# Patient Record
Sex: Male | Born: 1955 | Race: White | Hispanic: No | Marital: Married | Smoking: Current every day smoker
Health system: Southern US, Community
[De-identification: ages and names within clinical notes are randomized; demographics above are authoritative.]

## PROBLEM LIST (undated history)

## (undated) DIAGNOSIS — K219 Gastro-esophageal reflux disease without esophagitis: Secondary | ICD-10-CM

## (undated) DIAGNOSIS — I255 Ischemic cardiomyopathy: Secondary | ICD-10-CM

## (undated) DIAGNOSIS — F329 Major depressive disorder, single episode, unspecified: Secondary | ICD-10-CM

## (undated) DIAGNOSIS — I219 Acute myocardial infarction, unspecified: Secondary | ICD-10-CM

## (undated) DIAGNOSIS — Z8489 Family history of other specified conditions: Secondary | ICD-10-CM

## (undated) DIAGNOSIS — I2510 Atherosclerotic heart disease of native coronary artery without angina pectoris: Secondary | ICD-10-CM

## (undated) DIAGNOSIS — Z720 Tobacco use: Secondary | ICD-10-CM

## (undated) DIAGNOSIS — I10 Essential (primary) hypertension: Secondary | ICD-10-CM

## (undated) DIAGNOSIS — IMO0001 Reserved for inherently not codable concepts without codable children: Secondary | ICD-10-CM

## (undated) DIAGNOSIS — G4700 Insomnia, unspecified: Secondary | ICD-10-CM

## (undated) DIAGNOSIS — E785 Hyperlipidemia, unspecified: Secondary | ICD-10-CM

## (undated) DIAGNOSIS — F32A Depression, unspecified: Secondary | ICD-10-CM

## (undated) DIAGNOSIS — M109 Gout, unspecified: Secondary | ICD-10-CM

## (undated) DIAGNOSIS — J45909 Unspecified asthma, uncomplicated: Secondary | ICD-10-CM

## (undated) HISTORY — DX: Gastro-esophageal reflux disease without esophagitis: K21.9

## (undated) HISTORY — DX: Unspecified asthma, uncomplicated: J45.909

## (undated) HISTORY — DX: Essential (primary) hypertension: I10

## (undated) HISTORY — DX: Gout, unspecified: M10.9

## (undated) HISTORY — DX: Hyperlipidemia, unspecified: E78.5

## (undated) HISTORY — PX: HAND SURGERY: SHX662

## (undated) HISTORY — DX: Acute myocardial infarction, unspecified: I21.9

## (undated) HISTORY — DX: Insomnia, unspecified: G47.00

## (undated) HISTORY — PX: KNEE ARTHROSCOPY: SUR90

## (undated) HISTORY — DX: Tobacco use: Z72.0

## (undated) HISTORY — PX: CARDIAC SURGERY: SHX584

## (undated) HISTORY — PX: CATARACT EXTRACTION: SUR2

---

## 2002-05-07 ENCOUNTER — Ambulatory Visit (HOSPITAL_BASED_OUTPATIENT_CLINIC_OR_DEPARTMENT_OTHER): Admission: RE | Admit: 2002-05-07 | Discharge: 2002-05-07 | Payer: Self-pay | Admitting: Orthopaedic Surgery

## 2002-09-15 ENCOUNTER — Emergency Department (HOSPITAL_COMMUNITY): Admission: EM | Admit: 2002-09-15 | Discharge: 2002-09-15 | Payer: Self-pay

## 2003-05-21 ENCOUNTER — Observation Stay (HOSPITAL_COMMUNITY): Admission: EM | Admit: 2003-05-21 | Discharge: 2003-05-22 | Payer: Self-pay | Admitting: Emergency Medicine

## 2003-05-23 ENCOUNTER — Encounter: Admission: RE | Admit: 2003-05-23 | Discharge: 2003-05-23 | Payer: Self-pay | Admitting: Family Medicine

## 2003-05-28 ENCOUNTER — Inpatient Hospital Stay (HOSPITAL_COMMUNITY): Admission: EM | Admit: 2003-05-28 | Discharge: 2003-06-04 | Payer: Self-pay

## 2003-06-30 ENCOUNTER — Encounter (HOSPITAL_COMMUNITY): Admission: RE | Admit: 2003-06-30 | Discharge: 2003-09-19 | Payer: Self-pay | Admitting: Cardiology

## 2003-11-24 ENCOUNTER — Inpatient Hospital Stay (HOSPITAL_COMMUNITY): Admission: EM | Admit: 2003-11-24 | Discharge: 2003-11-26 | Payer: Self-pay | Admitting: *Deleted

## 2003-12-29 ENCOUNTER — Emergency Department (HOSPITAL_COMMUNITY): Admission: EM | Admit: 2003-12-29 | Discharge: 2003-12-30 | Payer: Self-pay

## 2004-11-17 ENCOUNTER — Ambulatory Visit: Payer: Self-pay | Admitting: Cardiology

## 2004-11-22 ENCOUNTER — Ambulatory Visit: Payer: Self-pay

## 2005-08-18 ENCOUNTER — Ambulatory Visit: Payer: Self-pay | Admitting: Cardiology

## 2005-11-10 ENCOUNTER — Ambulatory Visit: Payer: Self-pay | Admitting: Cardiology

## 2006-04-19 ENCOUNTER — Ambulatory Visit: Payer: Self-pay | Admitting: Cardiology

## 2006-05-01 ENCOUNTER — Ambulatory Visit: Payer: Self-pay | Admitting: Cardiology

## 2006-05-22 ENCOUNTER — Ambulatory Visit: Payer: Self-pay | Admitting: Cardiology

## 2006-11-29 ENCOUNTER — Ambulatory Visit: Payer: Self-pay | Admitting: Cardiology

## 2007-09-20 ENCOUNTER — Ambulatory Visit: Payer: Self-pay | Admitting: Cardiology

## 2007-10-02 ENCOUNTER — Ambulatory Visit: Payer: Self-pay

## 2007-10-02 LAB — CONVERTED CEMR LAB
ALT: 19 units/L (ref 0–53)
AST: 19 units/L (ref 0–37)
Albumin: 4 g/dL (ref 3.5–5.2)
Alkaline Phosphatase: 47 units/L (ref 39–117)
BUN: 11 mg/dL (ref 6–23)
Bilirubin, Direct: 0.1 mg/dL (ref 0.0–0.3)
CO2: 30 meq/L (ref 19–32)
Calcium: 9.3 mg/dL (ref 8.4–10.5)
Chloride: 103 meq/L (ref 96–112)
Cholesterol: 126 mg/dL (ref 0–200)
Creatinine, Ser: 0.9 mg/dL (ref 0.4–1.5)
GFR calc Af Amer: 114 mL/min
GFR calc non Af Amer: 94 mL/min
Glucose, Bld: 115 mg/dL — ABNORMAL HIGH (ref 70–99)
HDL: 28.1 mg/dL — ABNORMAL LOW (ref >39.0)
LDL Cholesterol: 67 mg/dL (ref 0–99)
Potassium: 5 meq/L (ref 3.5–5.1)
Sodium: 137 meq/L (ref 135–145)
Total Bilirubin: 0.8 mg/dL (ref 0.3–1.2)
Total CHOL/HDL Ratio: 4.5
Total Protein: 6.7 g/dL (ref 6.0–8.3)
Triglycerides: 155 mg/dL — ABNORMAL HIGH (ref 0–149)
VLDL: 31 mg/dL (ref 0–40)

## 2007-11-14 ENCOUNTER — Ambulatory Visit: Payer: Self-pay | Admitting: Internal Medicine

## 2008-01-03 ENCOUNTER — Ambulatory Visit: Payer: Self-pay | Admitting: Cardiovascular Disease

## 2008-01-22 ENCOUNTER — Ambulatory Visit: Payer: Self-pay | Admitting: Cardiology

## 2008-01-22 LAB — CONVERTED CEMR LAB
BUN: 11 mg/dL (ref 6–23)
CO2: 28 meq/L (ref 19–32)
Calcium: 9.4 mg/dL (ref 8.4–10.5)
Chloride: 97 meq/L (ref 96–112)
Creatinine, Ser: 0.9 mg/dL (ref 0.4–1.5)
GFR calc Af Amer: 113 mL/min
GFR calc non Af Amer: 93 mL/min
Glucose, Bld: 101 mg/dL — ABNORMAL HIGH (ref 70–99)
Potassium: 5.3 meq/L — ABNORMAL HIGH (ref 3.5–5.1)
Sodium: 130 meq/L — ABNORMAL LOW (ref 135–145)
TSH: 0.82 microintl units/mL (ref 0.35–5.50)

## 2008-11-18 ENCOUNTER — Telehealth: Payer: Self-pay | Admitting: Cardiology

## 2008-12-02 ENCOUNTER — Telehealth: Payer: Self-pay | Admitting: Cardiology

## 2008-12-02 DIAGNOSIS — I2589 Other forms of chronic ischemic heart disease: Secondary | ICD-10-CM | POA: Insufficient documentation

## 2008-12-02 DIAGNOSIS — F17200 Nicotine dependence, unspecified, uncomplicated: Secondary | ICD-10-CM

## 2008-12-02 DIAGNOSIS — E785 Hyperlipidemia, unspecified: Secondary | ICD-10-CM | POA: Insufficient documentation

## 2008-12-02 DIAGNOSIS — I509 Heart failure, unspecified: Secondary | ICD-10-CM | POA: Insufficient documentation

## 2008-12-02 DIAGNOSIS — I10 Essential (primary) hypertension: Secondary | ICD-10-CM | POA: Insufficient documentation

## 2008-12-02 DIAGNOSIS — R5381 Other malaise: Secondary | ICD-10-CM

## 2008-12-02 DIAGNOSIS — R5383 Other fatigue: Secondary | ICD-10-CM

## 2008-12-03 ENCOUNTER — Ambulatory Visit: Payer: Self-pay | Admitting: Cardiology

## 2008-12-03 DIAGNOSIS — R42 Dizziness and giddiness: Secondary | ICD-10-CM

## 2008-12-03 DIAGNOSIS — I951 Orthostatic hypotension: Secondary | ICD-10-CM

## 2008-12-04 ENCOUNTER — Encounter: Payer: Self-pay | Admitting: Internal Medicine

## 2008-12-04 ENCOUNTER — Ambulatory Visit: Payer: Self-pay | Admitting: Internal Medicine

## 2008-12-05 ENCOUNTER — Ambulatory Visit: Payer: Self-pay | Admitting: Cardiology

## 2008-12-05 ENCOUNTER — Inpatient Hospital Stay (HOSPITAL_BASED_OUTPATIENT_CLINIC_OR_DEPARTMENT_OTHER): Admission: RE | Admit: 2008-12-05 | Discharge: 2008-12-05 | Payer: Self-pay | Admitting: Cardiology

## 2008-12-05 ENCOUNTER — Observation Stay (HOSPITAL_COMMUNITY): Admission: EM | Admit: 2008-12-05 | Discharge: 2008-12-06 | Payer: Self-pay | Admitting: Cardiology

## 2008-12-05 DIAGNOSIS — E875 Hyperkalemia: Secondary | ICD-10-CM

## 2008-12-26 ENCOUNTER — Ambulatory Visit: Payer: Self-pay | Admitting: Cardiology

## 2008-12-26 DIAGNOSIS — I2510 Atherosclerotic heart disease of native coronary artery without angina pectoris: Secondary | ICD-10-CM

## 2008-12-26 DIAGNOSIS — R0602 Shortness of breath: Secondary | ICD-10-CM

## 2008-12-29 ENCOUNTER — Ambulatory Visit: Payer: Self-pay | Admitting: Cardiology

## 2008-12-29 ENCOUNTER — Encounter: Payer: Self-pay | Admitting: Internal Medicine

## 2008-12-29 ENCOUNTER — Ambulatory Visit: Payer: Self-pay

## 2008-12-30 LAB — CONVERTED CEMR LAB
ALT: 43 units/L (ref 0–53)
AST: 27 units/L (ref 0–37)
Albumin: 3.5 g/dL (ref 3.5–5.2)
Alkaline Phosphatase: 65 units/L (ref 39–117)
Bilirubin, Direct: 0.1 mg/dL (ref 0.0–0.3)
Cholesterol: 160 mg/dL (ref 0–200)
HDL: 36.5 mg/dL — ABNORMAL LOW (ref >39.00)
LDL Cholesterol: 85 mg/dL (ref 0–99)
TSH: 1.35 microintl units/mL (ref 0.35–5.50)
Total Bilirubin: 0.6 mg/dL (ref 0.3–1.2)
Total CHOL/HDL Ratio: 4
Total Protein: 6.6 g/dL (ref 6.0–8.3)
Triglycerides: 191 mg/dL — ABNORMAL HIGH (ref 0.0–149.0)
VLDL: 38.2 mg/dL (ref 0.0–40.0)

## 2009-01-09 ENCOUNTER — Ambulatory Visit: Payer: Self-pay | Admitting: Internal Medicine

## 2009-01-20 ENCOUNTER — Encounter: Payer: Self-pay | Admitting: Internal Medicine

## 2009-01-21 ENCOUNTER — Telehealth: Payer: Self-pay | Admitting: Internal Medicine

## 2009-01-26 ENCOUNTER — Telehealth: Payer: Self-pay | Admitting: Internal Medicine

## 2009-01-29 ENCOUNTER — Telehealth: Payer: Self-pay | Admitting: Internal Medicine

## 2009-02-17 ENCOUNTER — Telehealth: Payer: Self-pay | Admitting: Internal Medicine

## 2009-02-27 ENCOUNTER — Telehealth: Payer: Self-pay | Admitting: Internal Medicine

## 2009-06-22 ENCOUNTER — Encounter: Payer: Self-pay | Admitting: Cardiology

## 2009-06-22 ENCOUNTER — Ambulatory Visit: Payer: Self-pay | Admitting: Cardiology

## 2009-06-22 DIAGNOSIS — R1900 Intra-abdominal and pelvic swelling, mass and lump, unspecified site: Secondary | ICD-10-CM

## 2009-07-22 ENCOUNTER — Encounter (INDEPENDENT_AMBULATORY_CARE_PROVIDER_SITE_OTHER): Payer: Self-pay | Admitting: *Deleted

## 2009-07-22 DIAGNOSIS — A4902 Methicillin resistant Staphylococcus aureus infection, unspecified site: Secondary | ICD-10-CM | POA: Insufficient documentation

## 2009-08-03 ENCOUNTER — Ambulatory Visit: Payer: Self-pay | Admitting: Cardiology

## 2009-08-03 ENCOUNTER — Ambulatory Visit: Payer: Self-pay

## 2009-08-04 ENCOUNTER — Ambulatory Visit: Payer: Self-pay | Admitting: Infectious Diseases

## 2009-08-04 DIAGNOSIS — L299 Pruritus, unspecified: Secondary | ICD-10-CM | POA: Insufficient documentation

## 2009-08-04 LAB — CONVERTED CEMR LAB
ALT: 16 units/L (ref 0–53)
AST: 18 units/L (ref 0–37)
Albumin: 4.5 g/dL (ref 3.5–5.2)
Alkaline Phosphatase: 60 units/L (ref 39–117)
BUN: 10 mg/dL (ref 6–23)
Basophils Absolute: 0 10*3/uL (ref 0.0–0.1)
Basophils Relative: 0 % (ref 0–1)
CO2: 22 meq/L (ref 19–32)
Calcium: 9.3 mg/dL (ref 8.4–10.5)
Chloride: 97 meq/L (ref 96–112)
Creatinine, Ser: 0.8 mg/dL (ref 0.40–1.50)
Eosinophils Absolute: 0.3 10*3/uL (ref 0.0–0.7)
Eosinophils Relative: 3 % (ref 0–5)
Glucose, Bld: 133 mg/dL — ABNORMAL HIGH (ref 70–99)
HCT: 39.9 % (ref 39.0–52.0)
HCV Ab: NEGATIVE
Hemoglobin: 13.3 g/dL (ref 13.0–17.0)
Hep B S Ab: NEGATIVE
Hepatitis B Surface Ag: NEGATIVE
Lymphocytes Relative: 17 % (ref 12–46)
Lymphs Abs: 1.3 10*3/uL (ref 0.7–4.0)
MCHC: 33.3 g/dL (ref 30.0–36.0)
MCV: 92.6 fL (ref >=78.0)
Monocytes Absolute: 0.8 10*3/uL (ref 0.1–1.0)
Monocytes Relative: 10 % (ref 3–12)
Neutro Abs: 5.4 10*3/uL (ref 1.7–7.7)
Neutrophils Relative %: 69 % (ref 43–77)
Platelets: 231 10*3/uL (ref 150–400)
Potassium: 4.5 meq/L (ref 3.5–5.3)
RBC: 4.31 M/uL (ref 4.22–5.81)
RDW: 13.6 % (ref 11.5–15.5)
Sodium: 131 meq/L — ABNORMAL LOW (ref 135–145)
Total Bilirubin: 0.4 mg/dL (ref 0.3–1.2)
Total Protein: 7 g/dL (ref 6.0–8.3)
WBC: 7.8 10*3/uL (ref 4.0–10.5)

## 2009-08-10 ENCOUNTER — Telehealth: Payer: Self-pay | Admitting: Infectious Diseases

## 2009-10-01 ENCOUNTER — Ambulatory Visit: Payer: Self-pay | Admitting: Infectious Diseases

## 2009-12-18 ENCOUNTER — Telehealth: Payer: Self-pay | Admitting: Cardiology

## 2009-12-25 ENCOUNTER — Encounter (INDEPENDENT_AMBULATORY_CARE_PROVIDER_SITE_OTHER): Payer: Self-pay | Admitting: *Deleted

## 2010-03-18 ENCOUNTER — Telehealth: Payer: Self-pay | Admitting: Cardiology

## 2010-06-24 ENCOUNTER — Ambulatory Visit: Admit: 2010-06-24 | Payer: Self-pay | Admitting: Cardiology

## 2010-07-01 ENCOUNTER — Ambulatory Visit
Admission: RE | Admit: 2010-07-01 | Discharge: 2010-07-01 | Payer: Self-pay | Source: Home / Self Care | Attending: Orthopedic Surgery | Admitting: Orthopedic Surgery

## 2010-07-05 LAB — POCT I-STAT, CHEM 8
BUN: 11 mg/dL (ref 6–23)
Calcium, Ion: 1.11 mmol/L — ABNORMAL LOW (ref 1.12–1.32)
Chloride: 99 mEq/L (ref 96–112)
Creatinine, Ser: 0.8 mg/dL (ref 0.4–1.5)
Glucose, Bld: 92 mg/dL (ref 70–99)
HCT: 49 % (ref 39.0–52.0)
Hemoglobin: 16.7 g/dL (ref 13.0–17.0)
Potassium: 4.2 mEq/L (ref 3.5–5.1)
Sodium: 133 mEq/L — ABNORMAL LOW (ref 135–145)
TCO2: 28 mmol/L (ref 0–100)

## 2010-07-18 LAB — CONVERTED CEMR LAB
ALT: 16 units/L (ref 0–53)
AST: 14 units/L (ref 0–37)
Albumin: 4.1 g/dL (ref 3.5–5.2)
Alkaline Phosphatase: 59 units/L (ref 39–117)
BUN: 26 mg/dL — ABNORMAL HIGH (ref 6–23)
BUN: 27 mg/dL — ABNORMAL HIGH (ref 6–23)
BUN: 9 mg/dL (ref 6–23)
Basophils Absolute: 0 10*3/uL (ref 0.0–0.1)
Basophils Relative: 0.3 % (ref 0.0–3.0)
Bilirubin, Direct: 0 mg/dL (ref 0.0–0.3)
CO2: 25 meq/L (ref 19–32)
CO2: 26 meq/L (ref 19–32)
CO2: 29 meq/L (ref 19–32)
Calcium: 9 mg/dL (ref 8.4–10.5)
Calcium: 9 mg/dL (ref 8.4–10.5)
Calcium: 9.1 mg/dL (ref 8.4–10.5)
Chloride: 102 meq/L (ref 96–112)
Chloride: 98 meq/L (ref 96–112)
Chloride: 99 meq/L (ref 96–112)
Cholesterol: 148 mg/dL (ref 0–200)
Creatinine, Ser: 0.9 mg/dL (ref 0.4–1.5)
Creatinine, Ser: 1.3 mg/dL (ref 0.4–1.5)
Creatinine, Ser: 1.8 mg/dL — ABNORMAL HIGH (ref 0.4–1.5)
Eosinophils Absolute: 0.4 10*3/uL (ref 0.0–0.7)
Eosinophils Relative: 4 % (ref 0.0–5.0)
GFR calc non Af Amer: 41.85 mL/min (ref >60)
GFR calc non Af Amer: 60.92 mL/min (ref >60)
GFR calc non Af Amer: 92.9 mL/min (ref >60)
Glucose, Bld: 114 mg/dL — ABNORMAL HIGH (ref 70–99)
Glucose, Bld: 88 mg/dL (ref 70–99)
Glucose, Bld: 93 mg/dL (ref 70–99)
HCT: 40.4 % (ref 39.0–52.0)
HDL: 36.6 mg/dL — ABNORMAL LOW (ref >39.00)
Hemoglobin: 13.8 g/dL (ref 13.0–17.0)
INR: 1 (ref 0.8–1.0)
LDL Cholesterol: 87 mg/dL (ref 0–99)
Lymphocytes Relative: 23.2 % (ref 12.0–46.0)
Lymphs Abs: 2.6 10*3/uL (ref 0.7–4.0)
MCHC: 34.1 g/dL (ref 30.0–36.0)
MCV: 90.6 fL (ref 78.0–100.0)
Monocytes Absolute: 1.1 10*3/uL — ABNORMAL HIGH (ref 0.1–1.0)
Monocytes Relative: 9.8 % (ref 3.0–12.0)
Neutro Abs: 7 10*3/uL (ref 1.4–7.7)
Neutrophils Relative %: 62.7 % (ref 43.0–77.0)
Platelets: 289 10*3/uL (ref 150.0–400.0)
Potassium: 4.9 meq/L (ref 3.5–5.1)
Potassium: 5 meq/L (ref 3.5–5.1)
Potassium: 5.7 meq/L — ABNORMAL HIGH (ref 3.5–5.1)
Prothrombin Time: 11.1 s (ref 10.9–13.3)
RBC: 4.46 M/uL (ref 4.22–5.81)
RDW: 12.2 % (ref 11.5–14.6)
Sodium: 131 meq/L — ABNORMAL LOW (ref 135–145)
Sodium: 131 meq/L — ABNORMAL LOW (ref 135–145)
Sodium: 136 meq/L (ref 135–145)
Total Bilirubin: 0.5 mg/dL (ref 0.3–1.2)
Total CHOL/HDL Ratio: 4
Total Protein: 6.9 g/dL (ref 6.0–8.3)
Triglycerides: 123 mg/dL (ref 0.0–149.0)
VLDL: 24.6 mg/dL (ref 0.0–40.0)
WBC: 11.1 10*3/uL — ABNORMAL HIGH (ref 4.5–10.5)
aPTT: 30.8 s — ABNORMAL HIGH (ref 21.7–28.8)

## 2010-07-20 NOTE — Letter (Signed)
Summary: Generic Letter  Architectural technologist, Main Office  [address]   [place], [place] [address]   Phone: [phone]  Fax: [phone]        December 25, 2009 MRN: [id number]    [facility] [address] [place], [place]  [address]   TO WHOM IT MAY CONCERN,          Jimmy Lewis is followed in our cardiology clinic. Due to his history he should not work if the temperature is over 87 degrees or less than 40 degrees. Please contact us with any questions or concerns.   Sincerely,  Deliah Goody, RN/Dr Olga Millers

## 2010-07-20 NOTE — Progress Notes (Signed)
Summary: Questions about  boils  Phone Note Call from Patient   Caller: [phone] Summary of Call: Voicemail:  Pt called c/o he has MRSA boil on his elbow. They would like to come for visit today,   have it lanced and start antibiotics.  Return call:: Pt advised per Dr Sampson Goon  he should return to his primary care doctor for I&D with culture and treatment.    We were a one time consult. I spoke with a male in the home who placed the initial call and gave the above information. Tomasita Morrow RN  August 10, 2009 2:25 PM   Initial call taken by: Tomasita Morrow RN,  August 10, 2009 2:25 PM

## 2010-07-20 NOTE — Assessment & Plan Note (Signed)
Summary: new pt mrsa   Visit Type:  Consult Primary Provider:  Tomi Bamberger  CC:  new patient MRSA.  History of Present Illness: 55 yo Wm with htn hyperlipidemia, Has had pruritis for over 2 years and has been treated with multiple regimen including for scabies.  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  At least 4 lesions that has had to be lanced in clinic.  No hospitalization for it.  No systemic symptoms with these boils either. grandson has had lesions on knees - he lives wtih them.   No other family members have it.  Main problem is his itching which occurs every day and drives him crazy.  Worse with heat.  Preventive Screening-Counseling & Management  Alcohol-Tobacco     Alcohol drinks/day: 3-12oz can per day     Alcohol type: beer     Smoking Status: current     Packs/Day: 1.0  Caffeine-Diet-Exercise     Caffeine use/day: coffee,tea and sodas     Does Patient Exercise: no     Type of exercise: active at work  Environmental education officer Use: yes   Updated Prior Medication List: COREG 25 MG TABS (CARVEDILOL) Take 1/2  tablet by mouth twice a day HYDROCHLOROTHIAZIDE 12.5 MG CAPS (HYDROCHLOROTHIAZIDE) 1 tab on Mon- Wed & Friday PLAVIX 75 MG TABS (CLOPIDOGREL BISULFATE) 1 tablet by mouth once a day ASPIRIN EC 325 MG TBEC (ASPIRIN) Take one tablet by mouth daily LUNESTA 2 MG TABS (ESZOPICLONE) as needed FLUOXETINE HCL 20 MG CAPS (FLUOXETINE HCL) 1 tab by mouth once daily NITROGLYCERIN 0.4 MG SUBL (NITROGLYCERIN) One tablet under tongue every 5 minutes as needed for chest pain---may repeat times three VENTOLIN HFA 108 (90 BASE) MCG/ACT AERS (ALBUTEROL SULFATE) 2 puffs every 4 hrs VITAMIN D 1000 UNIT TABS (CHOLECALCIFEROL) 1 tab two times a day LISINOPRIL 10 MG TABS (LISINOPRIL) 1 tab once daily TERAZOSIN HCL 5 MG CAPS (TERAZOSIN HCL) 1 cap once daily SIMVASTATIN 40 MG TABS (SIMVASTATIN) one daily  Current Allergies (reviewed today): ! * NO  SHELLFISH ALLERGY ! * NO IVP DYE ALLERGY ! * NO LATEX ALLERGY AMOXICILLIN (AMOXICILLIN) PERCODAN (OXYCODONE-ASPIRIN) Past History:  Past Medical History: Last updated: 06/22/2009 Current Problems:  ISCHEMIC CARDIOMYOPATHY (ICD-414.8) HEART FAILURE (ICD-428.9 Class I congestive heart failure.  HYPERTENSION (ICD-401.9) HYPERLIPIDEMIA (ICD-272.4) TOBACCO ABUSE (ICD-305.1)  MI in December 2004. Coronary artery disease arthritis  Past Surgical History: Last updated: 06/22/2009   Arthroscopy of the right knee.  Cataract removal in 2002.  Family History: Last updated: 12/02/2008   Family history is contributory for early coronary artery  disease in his father, brother and mother.  Social History: Last updated: 12/02/2008  works as a Psychologist, occupational  The patient is married.  He lives in [place].   He continues to smoke cigarettes unfortunately.  He drinks two to three  alcoholic beverages a day.      Risk Factors: Alcohol Use: 3-12oz can per day (08/04/2009) Caffeine Use: coffee,tea and sodas (08/04/2009) Exercise: no (08/04/2009)  Risk Factors: Smoking Status: current (08/04/2009) Packs/Day: 1.0 (08/04/2009)  Review of Systems       11 systems reviewed and negative except per HPI   Vital Signs:  Patient profile:   55 year old male Height:      73 inches (185.42 cm) Weight:      179.0 pounds (81.36 kg) BMI:     23.70 Temp:     97.2 degrees F (36.22  degrees C) oral Pulse rate:   65 / minute BP sitting:   141 / 80  (left arm)  Vitals Entered By: Kathi Simpers [facility]) (August 04, 2009 10:55 AM) CC: new patient MRSA Pain Assessment Patient in pain? no      Nutritional Status BMI of 19 -24 = normal Nutritional Status Detail appetite is normal per patient  Does patient need assistance? Functional Status Self care Ambulation Normal   Physical Exam  General:  alert and well-developed.   Head:  normocephalic.   Eyes:  vision grossly intact and pupils  equal.   Mouth:  upper plate Neck:  supple.   Lungs:  normal respiratory effort, no intercostal retractions, and no accessory muscle use.   Heart:  normal rate, regular rhythm, and no murmur.   Abdomen:  soft, non-tender, and normal bowel sounds.   Msk:  normal ROM and no joint tenderness.   Extremities:  no cce Skin:  multiple sites of healed burns on arms.  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  No active boils Psych:  Oriented X3 and memory intact for recent and remote.   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   Advised re  sharing towels, hygiene and need to present for I and D and antibiotics if lesions recur.    Orders: T-Comprehensive Metabolic Panel [id number]) T-CBC w/Diff [id number]) T-Hepatitis B Surface Antigen [id number]) T-Hepatitis B Surface Antibody ([id number]) T-Hepatitis C Antibody ([id number]) Consultation Level IV (62376)  Problem # 2:  PRURITUS (ICD-698.9)  had elevated eos on prior cbc.  Will repeat today and consider treatment.  Will also check LFTs and hep serologies as he is a heavy drinker. ? eczema - start eucerin cream Orders: T-Comprehensive Metabolic Panel ([id number]) T-CBC w/Diff ([id number]) T-Hepatitis B Surface Antigen ([id number]) T-Hepatitis B Surface Antibody ([id number]) T-Hepatitis C Antibody ([id number])  Medications Added to Medication List This Visit: 1)  Bactroban 2 % Oint (Mupirocin) .... Apply to nares twice daily  Patient Instructions: 1)  Suppressive MRSA treatment -  Recommended to use bactroban nasal ointment to anterior  nares two times a day and hibiclens 4% shower lotion daily until you see me.   2)  Follow up in 6 weeks 3)  Use eucerin cream or lotion for  the itching Prescriptions: BACTROBAN 2 % OINT (MUPIROCIN) apply to nares twice daily  #1 x 4   Entered and Authorized by:   Clydie Braun MD   Signed by:   Clydie Braun MD on 08/04/2009   Method used:   Print then Give to Patient   RxID:   [id number]

## 2010-07-20 NOTE — Assessment & Plan Note (Signed)
Summary: f24m/dm   Visit Type:  6 mo f/u  CC:  no cardiac complaints today.  History of Present Illness: Jimmy Lewis is a pleasant gentleman who has a history of anterior myocardial function in December 2004, followed by drug-eluting stent to his LAD.  He also subsequently had drug-eluting stent to his right coronary artery and circumflex 5 days later.   A cardiac catheterization was  performed on December 05, 2008 for exertional dyspnea. There was concern that this was an anginal level. His ejection fraction was 35%. There was an ostial filling defect in the LAD but otherwise nonobstructive disease. IVUS was performed and subsequently the patient had a drug-eluting stent to the LAD. Last echocardiogram was performed in July of 2010 and showed an ejection fraction of 40-45%. This was an improvement from previous. He was seen by Dr. Ladona Ridgel and given that his LV function had improved ICD was felt not to be indicated. Since I last saw him in July of 2010 the patient has dyspnea with more extreme activities and not with routine activities. It is relieved with rest. It is not associated with chest pain. There is no orthopnea, PND or pedal edema. There is no syncope or palpitations. There is no exertional chest pain.   Current Medications (verified): 1)  Coreg 25 Mg Tabs (Carvedilol) .... Take 1/2  Tablet By Mouth Twice A Day 2)  Hydrochlorothiazide 12.5 Mg Caps (Hydrochlorothiazide) .Marland Kitchen.. 1 Tab On Mon- Wed & Friday 3)  Plavix 75 Mg Tabs (Clopidogrel Bisulfate) .Marland Kitchen.. 1 Tablet By Mouth Once A Day 4)  Aspirin Ec 325 Mg Tbec (Aspirin) .... Take One Tablet By Mouth Daily 5)  Lunesta 2 Mg Tabs (Eszopiclone) .... As Needed 6)  Fluoxetine Hcl 20 Mg Caps (Fluoxetine Hcl) .Marland Kitchen.. 1 Tab By Mouth Once Daily 7)  Nitroglycerin 0.4 Mg Subl (Nitroglycerin) .... One Tablet Under Tongue Every 5 Minutes As Needed For Chest Pain---May Repeat Times Three 8)  Ventolin Hfa 108 (90 Base) Mcg/act Aers (Albuterol Sulfate) .... 2 Puffs  Every 4 Hrs 9)  Vitamin D 1000 Unit Tabs (Cholecalciferol) .Marland Kitchen.. 1 Tab Two Times A Day 10)  Lisinopril 10 Mg Tabs (Lisinopril) .Marland Kitchen.. 1 Tab Once Daily 11)  Terazosin Hcl 5 Mg Caps (Terazosin Hcl) .Marland Kitchen.. 1 Cap Once Daily  Allergies: 1)  ! * No Shellfish Allergy 2)  ! * No Ivp Dye Allergy 3)  ! * No Latex Allergy 4)  Amoxicillin (Amoxicillin) 5)  Percodan (Oxycodone-Aspirin)  Past History:  Past Medical History: Current Problems:  ISCHEMIC CARDIOMYOPATHY (ICD-414.8) HEART FAILURE (ICD-428.9 Class I congestive heart failure.  HYPERTENSION (ICD-401.9) HYPERLIPIDEMIA (ICD-272.4) TOBACCO ABUSE (ICD-305.1)  MI in December 2004. Coronary artery disease arthritis  Past Surgical History:   Arthroscopy of the right knee.  Cataract removal in 2002.  Social History: Reviewed history from 12/02/2008 and no changes required.  works as a Psychologist, occupational  The patient is married.  He lives in [place].   He continues to smoke cigarettes unfortunately.  He drinks two to three  alcoholic beverages a day.      Review of Systems       Problems with arthritis and occasional fatigue. Also occasional indigestion after eating  but no fevers or chills, productive cough, hemoptysis, dysphasia, odynophagia, melena, hematochezia, dysuria, hematuria, rash, seizure activity, orthopnea, PND, pedal edema, claudication. Remaining systems are negative.   Vital Signs:  Patient profile:   56 year old male Height:      73 inches Weight:  180 pounds BMI:     23.83 Pulse rate:   54 / minute Pulse rhythm:   regular BP sitting:   110 / 70  (right arm) Cuff size:   regular  Vitals Entered By: Jimmy Lewis, CMA (June 22, 2009 8:49 AM)  Physical Exam  General:  Well-developed well-nourished in no acute distress.  Skin is warm and dry.  HEENT is normal.  Neck is supple. No thyromegaly.  Chest is clear to auscultation with normal expansion.  Cardiovascular exam is regular rate and rhythm.  Abdominal  exam nontender or distended. Possible pulsatile mass palpated in the mid abdominal area. Extremities show no edema. neuro grossly intact    Impression & Recommendations:  Problem # 1:  CAD (ICD-414.00)  Continue aspirin, Plavix, beta blocker and ACE inhibitor. Resume statin. His updated medication list for this problem includes:    Coreg 25 Mg Tabs (Carvedilol) .Marland Kitchen... Take 1/2  tablet by mouth twice a day    Plavix 75 Mg Tabs (Clopidogrel bisulfate) .Marland Kitchen... 1 tablet by mouth once a day    Aspirin Ec 325 Mg Tbec (Aspirin) .Marland Kitchen... Take one tablet by mouth daily    Nitroglycerin 0.4 Mg Subl (Nitroglycerin) ..... One tablet under tongue every 5 minutes as needed for chest pain---may repeat times three    Lisinopril 10 Mg Tabs (Lisinopril) .Marland Kitchen... 1 tab once daily  His updated medication list for this problem includes:    Coreg 25 Mg Tabs (Carvedilol) .Marland Kitchen... Take 1/2  tablet by mouth twice a day    Plavix 75 Mg Tabs (Clopidogrel bisulfate) .Marland Kitchen... 1 tablet by mouth once a day    Aspirin Ec 325 Mg Tbec (Aspirin) .Marland Kitchen... Take one tablet by mouth daily    Nitroglycerin 0.4 Mg Subl (Nitroglycerin) ..... One tablet under tongue every 5 minutes as needed for chest pain---may repeat times three    Lisinopril 10 Mg Tabs (Lisinopril) .Marland Kitchen... 1 tab once daily  Problem # 2:  HYPERTENSION (ICD-401.9)  Blood pressure controlled on present medications. Will continue. Check bmet. The following medications were removed from the medication list:    Terazosin Hcl 5 Mg Caps (Terazosin hcl) .Marland Kitchen... Take one daily His updated medication list for this problem includes:    Coreg 25 Mg Tabs (Carvedilol) .Marland Kitchen... Take 1/2  tablet by mouth twice a day    Hydrochlorothiazide 12.5 Mg Caps (Hydrochlorothiazide) .Marland Kitchen... 1 tab on mon- wed & friday    Aspirin Ec 325 Mg Tbec (Aspirin) .Marland Kitchen... Take one tablet by mouth daily    Lisinopril 10 Mg Tabs (Lisinopril) .Marland Kitchen... 1 tab once daily    Terazosin Hcl 5 Mg Caps (Terazosin hcl) .Marland Kitchen... 1 cap once  daily  The following medications were removed from the medication list:    Terazosin Hcl 5 Mg Caps (Terazosin hcl) .Marland Kitchen... Take one daily His updated medication list for this problem includes:    Coreg 25 Mg Tabs (Carvedilol) .Marland Kitchen... Take 1/2  tablet by mouth twice a day    Hydrochlorothiazide 12.5 Mg Caps (Hydrochlorothiazide) .Marland Kitchen... 1 tab on mon- wed & friday    Aspirin Ec 325 Mg Tbec (Aspirin) .Marland Kitchen... Take one tablet by mouth daily    Lisinopril 10 Mg Tabs (Lisinopril) .Marland Kitchen... 1 tab once daily    Terazosin Hcl 5 Mg Caps (Terazosin hcl) .Marland Kitchen... 1 cap once daily  Problem # 3:  HYPERLIPIDEMIA (ICD-272.4)  Resume Zocor at 40 mg p.o. daily. Check lipids and liver in 6 weeks.  His updated medication list for this  problem includes:    Simvastatin 40 Mg Tabs (Simvastatin) ..... One daily  Problem # 4:  TOBACCO ABUSE (ICD-305.1) Patient counseled on discontinuing for between 3-10 minutes.  Problem # 5:  ISCHEMIC CARDIOMYOPATHY (ICD-414.8)  Continue beta blocker and ACE inhibitor. LV function improved on most recent echocardiogram and ICD presently not indicated. His updated medication list for this problem includes:    Coreg 25 Mg Tabs (Carvedilol) .Marland Kitchen... Take 1/2  tablet by mouth twice a day    Hydrochlorothiazide 12.5 Mg Caps (Hydrochlorothiazide) .Marland Kitchen... 1 tab on mon- wed & friday    Plavix 75 Mg Tabs (Clopidogrel bisulfate) .Marland Kitchen... 1 tablet by mouth once a day    Aspirin Ec 325 Mg Tbec (Aspirin) .Marland Kitchen... Take one tablet by mouth daily    Nitroglycerin 0.4 Mg Subl (Nitroglycerin) ..... One tablet under tongue every 5 minutes as needed for chest pain---may repeat times three    Lisinopril 10 Mg Tabs (Lisinopril) .Marland Kitchen... 1 tab once daily  His updated medication list for this problem includes:    Coreg 25 Mg Tabs (Carvedilol) .Marland Kitchen... Take 1/2  tablet by mouth twice a day    Hydrochlorothiazide 12.5 Mg Caps (Hydrochlorothiazide) .Marland Kitchen... 1 tab on mon- wed & friday    Plavix 75 Mg Tabs (Clopidogrel bisulfate) .Marland Kitchen...  1 tablet by mouth once a day    Aspirin Ec 325 Mg Tbec (Aspirin) .Marland Kitchen... Take one tablet by mouth daily    Nitroglycerin 0.4 Mg Subl (Nitroglycerin) ..... One tablet under tongue every 5 minutes as needed for chest pain---may repeat times three    Lisinopril 10 Mg Tabs (Lisinopril) .Marland Kitchen... 1 tab once daily  Other Orders: Abdominal Aorta Duplex (Abd Aorta Duplex)  Patient Instructions: 1)  Your physician recommends that you schedule a follow-up appointment in: 12 months Prescriptions: SIMVASTATIN 40 MG TABS (SIMVASTATIN) one daily  #30 x 11   Entered by:   Jimmy Capuchin, RN   Authorized by:   Ferman Hamming, MD, [facility]   Signed by:   Jimmy Capuchin, RN on 06/22/2009   Method used:   Electronically to        [facility]  [address] [id number] (retail)       [address]       [place], [place]  [address]       Ph: [phone]       Fax: [phone]   RxID:   [id number]

## 2010-07-20 NOTE — Miscellaneous (Signed)
Summary: HIPAA Restrictions  HIPAA Restrictions   Imported By: Florinda Marker 08/04/2009 15:00:13  _____________________________________________________________________  External Attachment:    Type:   Image     Comment:   External Document

## 2010-07-20 NOTE — Letter (Signed)
Summary: Out of Work  [employer], Main Office  [address]   [place], [place] [address]   Phone: [phone]  Fax: [phone]        June 22, 2009   Employee:  KHALIL SZCZEPANIK    To Whom It May Concern:   For Medical reasons, please excuse the above named employee from work for the following dates:  Start:   06/22/2009  End:   06/22/2009  If you need additional information, please feel free to contact our office.         Sincerely,    Charolotte Capuchin, RN for  Dr Olga Millers

## 2010-07-20 NOTE — Progress Notes (Signed)
Summary: needs letter for work  Phone Note Call from Patient   Caller: Spouse carolyn [phone] Reason for Call: Talk to Nurse Summary of Call: pt's wife calling about getting a letter stating that due to the pt working outside that when the tempature gets to a certain degree-he needs to go home- AVW:UJWJ jenkins pls mail to pt's home address- Initial call taken by: Glynda Jaeger,  December 18, 2009 4:45 PM  Follow-up for Phone Call        Patient does not have cardiac restrictions Ferman Hamming, MD, [facility]  December 20, 2009 1:12 PM  pt wife aware, she states we have given him a note before for work. reviewed pt chart with dr Jens Som. note to be mailed to pt home Deliah Goody, RN  December 25, 2009 2:03 PM

## 2010-07-20 NOTE — Progress Notes (Signed)
Summary: can afford plavix- changed meds or samples  Phone Note Call from Patient Call back at Home Phone [phone] Call back at [phone]   Caller: Spouse- carolyn  Reason for Call: Talk to Nurse Summary of Call: pt unable to afford plavix, do we have any sample of changed meds to something cheaper.  Initial call taken by: Lorne Skeens,  March 18, 2010 10:18 AM  Follow-up for Phone Call        samples of plavix placed at the front desk for pick up. Left message to call back, also need to ask pt for proof of income to send application to [employer] for pt assistance program Deliah Goody, RN  March 18, 2010 2:34 PM  Left message to call back Deliah Goody, RN  March 24, 2010 9:50 AM   Additional Follow-up for Phone Call Additional follow up Details #1::        pt's wife rtn call [phone] Glynda Jaeger  March 24, 2010 11:49 AM  Additional Follow-up by: Glynda Jaeger,  March 24, 2010 11:49 AM    Additional Follow-up for Phone Call Additional follow up Details #2::    spoke with pt wife, she states they have tried to get assistance before and they were told they make too much. she states the pt has been off the plavix for 2 months because they can not afford. will discuss with dr Jens Som if pt needs to restart Deliah Goody, RN  March 26, 2010 10:54 AM\par  Additional Follow-up for Phone Call Additional follow up Details #3:: Details for Additional Follow-up Action Taken: dc plavix Ferman Hamming, MD, [facility]  March 26, 2010 11:41 AM  pt wife aware Deliah Goody, RN  March 29, 2010 10:33 AM

## 2010-07-20 NOTE — Assessment & Plan Note (Signed)
Summary: 6 WK F/U/VS   Primary Provider:  Tomi Bamberger  CC:  6 week follow up.  History of Present Illness: 55 yo Wm with htn hyperlipidemia.  I saw in Feb and started on  Has had pruritis for over 2 years and has been treated with multiple regimen including for scabies.  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  At least 4 lesions that has had to be lanced in clinic.  No hospitalization for it.  No systemic symptoms with these boils either. grandson has had lesions on knees - he lives wtih them.   No other family members have it.  Main problem is his itching which occurs every day and drives him crazy.  Worse with heat.  When I saw him in Feb we started bactroban and hibiclens.  He has had one boil on his R elbow.  PMD felt if could no be lanced but he ended up draining it himself.  A good deal of pus came out.  given abx and it healed up.  He is not sure which one it was (thinks it was bactrim)  - he had diarrhea and stomach pain with it.   He is still quite itchy on arms and legs- he does not have it on his back abd or face.     Preventive Screening-Counseling & Management  Alcohol-Tobacco     Alcohol drinks/day: 3-12oz can per day     Alcohol type: beer     Smoking Status: current     Packs/Day: 1.0  Caffeine-Diet-Exercise     Caffeine use/day: coffee,tea and sodas     Does Patient Exercise: no     Type of exercise: active at work  Environmental education officer Use: yes   Current Allergies (reviewed today): ! * NO SHELLFISH ALLERGY ! * NO IVP DYE ALLERGY ! * NO LATEX ALLERGY AMOXICILLIN (AMOXICILLIN) PERCODAN (OXYCODONE-ASPIRIN) Vital Signs:  Patient profile:   55 year old male Height:      73 inches (185.42 cm) Weight:      177.12 pounds (80.51 kg) BMI:     23.45 Temp:     97.6 degrees F (36.44 degrees C) oral Pulse rate:   49 / minute BP sitting:   138 / 88  (right arm)  Vitals Entered By: Baxter Hire) (October 01, 2009 9:34  AM) CC: 6 week follow up Is Patient Diabetic? No Pain Assessment Patient in pain? no      Nutritional Status BMI of 19 -24 = normal Nutritional Status Detail appetite is good per patient  Does patient need assistance? Functional Status Self care Ambulation Normal   Physical Exam  General:  alert and well-developed.   Head:  normocephalic.   Eyes:  vision grossly intact, pupils equal, and pupils round.   Mouth:  poor dentition.   Neck:  supple.   Lungs:  normal respiratory effort.   Heart:  normal rate.   Abdomen:  soft.   Msk:  normal ROM.   Skin:  multiple sites of healed burns on arms.  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  No active boils Psych:  Oriented X3 and memory intact for recent and remote.     Impression & Recommendations:  Problem # 1:  MRSA (VZD-638.75)  He has tried suppressive bactroban and hbiclens but had one break through boil - that was actually right at the beginning of his course of topical treatment. Will  treat with continued suppressive therapy with bactroban, hibiclens.  WIll consider suppressive doxy once daily if another lesion recurs.  Suppressive MRSA treatment -  Recommended to use bactroban nasal ointment to anterior  nares two times a day and hibiclens 4% shower lotion daily for next month.   Advised re  sharing towels, hygiene and need to present for I and D and antibiotics if lesions recur.    Orders: T-Comprehensive Metabolic Panel [id number]) T-CBC w/Diff [id number]) T-Hepatitis B Surface Antigen [id number]) T-Hepatitis B Surface Antibody ([id number]) T-Hepatitis C Antibody ([id number]) Consultation Level IV (43329)  Orders: Est. Patient Level IV (51884)  Problem # 2:  PRURITUS (ICD-698.9)  Had elevated eos on prior cbcbut on repeat it was negative.   LFTs and hep serologies negative.   ? eczema - is taking some   Orders: Est. Patient Level IV (16606) Dermatology Referral (Derma)  Medications  Added to Medication List This Visit: 1)  Doxycycline Hyclate 100 Mg Caps (Doxycycline hyclate) .... One by mouth two times a day  Patient Instructions: 1)  Suppressive MRSA treatment -  Continue to use bactroban nasal ointment to anterior  nares two times a day and hibiclens 4% shower lotion several times a week. 2)  I have given an rx for doxycycline.  If lesions recure start taking it twice a day for 10 days then go to once a day and continue for a month. (remember it can make you extra sensitive to sun burn). 3)  If lesions recur please call to make another appointment. 4)  Follow up with dermatology for itching. Prescriptions: DOXYCYCLINE HYCLATE 100 MG CAPS (DOXYCYCLINE HYCLATE) one by mouth two times a day  #60 x 2   Entered and Authorized by:   Clydie Braun MD   Signed by:   Clydie Braun MD on 10/01/2009   Method used:   Electronically to        [facility]  [address] [phone]* (retail)       [address]       [place], [place]  [address]       Ph: [phone]       Fax: [phone]   RxID:   [id number] DOXYCYCLINE HYCLATE 100 MG CAPS (DOXYCYCLINE HYCLATE) one by mouth two times a day  #60 x 1   Entered and Authorized by:   Clydie Braun MD   Signed by:   Clydie Braun MD on 10/01/2009   Method used:   [facility]ically to        [facility]  [address] [phone]* (retail)       [address]       [place], [place]  [address]       Ph: [phone]       Fax: [phone]   RxID:   [id number]

## 2010-07-20 NOTE — Miscellaneous (Signed)
Summary: Problems and Medications updated  Clinical Lists Changes  Problems: Added new problem of MRSA (ICD-041.12) - recurrent cellulitis, arms Medications: Added new medication of SMZ-TMP DS 800-160 MG TABS (SULFAMETHOXAZOLE-TRIMETHOPRIM) Take 1 tablet by mouth two times a day per PCP

## 2010-07-20 NOTE — Consult Note (Signed)
Summary: New Pt. Referral: [facility]  New Pt. Referral: [facility]   Imported By: Florinda Marker 08/06/2009 08:55:51  _____________________________________________________________________  External Attachment:    Type:   Image     Comment:   External Document

## 2010-07-20 NOTE — Letter (Signed)
Summary: Custom - Lipid  [facility], Main Office  [address]   [place], [place] [address]   Phone: [phone]  Fax: [phone]       August 03, 2009 MRN: [id number]   Jimmy Lewis [address] [place], [place]  [address]   Dear Mr. Brunelli,  We have reviewed your cholesterol results.  They are as follows:     Total Cholesterol:    148 (Desirable: less than 200)       HDL  Cholesterol:     36.60  (Desirable: greater than 40 for men and 50 for women)       LDL Cholesterol:       87  (Desirable: less than 100 for low risk and less than 70 for moderate to high risk)       Triglycerides:       123.0  (Desirable: less than 150)  Our recommendations include:  CONTINUE THE SAME   Call our office at the number listed above if you have any questions.  Lowering your LDL cholesterol is important, but it is only one of a large number of "risk factors" that may indicate that you are at risk for heart disease, stroke or other complications of hardening of the arteries.  Other risk factors include:   A.  Cigarette Smoking* B.  High Blood Pressure* C.  Obesity* D.   Low HDL Cholesterol (see yours above)* E.   Diabetes Mellitus (higher risk if your is uncontrolled) F.  Family history of premature heart disease G.  Previous history of stroke or cardiovascular disease    *These are risk factors YOU HAVE CONTROL OVER.  For more information, visit .  There is now evidence that lowering the TOTAL CHOLESTEROL AND LDL CHOLESTEROL can reduce the risk of heart disease.  The American Heart     Association recommends the following guidelines for the treatment of elevated cholesterol:  1.  If there is now current heart disease and less than two risk factors, TOTAL CHOLESTEROL should be less than 200 and LDL CHOLESTEROL should be less than 100. 2.  If there is current heart disease or two or more risk factors, TOTAL CHOLESTEROL should be less than 200 and LDL  CHOLESTEROL should be less than 70.  A diet low in cholesterol, saturated fat, and calories is the cornerstone of treatment for elevated cholesterol.  Cessation of smoking and exercise are also important in the management of elevated cholesterol and preventing vascular disease.  Studies have shown that 30 to 60 minutes of physical activity most days can help lower blood pressure, lower cholesterol, and keep your weight at a healthy level.  Drug therapy is used when cholesterol levels do not respond to therapeutic lifestyle changes (smoking cessation, diet, and exercise) and remains unacceptably high.  If medication is started, it is important to have you levels checked periodically to evaluate the need for further treatment options.  Thank you,    [facility] Team

## 2010-07-29 ENCOUNTER — Ambulatory Visit (INDEPENDENT_AMBULATORY_CARE_PROVIDER_SITE_OTHER): Payer: BC Managed Care – PPO | Admitting: Cardiology

## 2010-07-29 ENCOUNTER — Other Ambulatory Visit: Payer: Self-pay | Admitting: Cardiology

## 2010-07-29 ENCOUNTER — Encounter: Payer: Self-pay | Admitting: Cardiology

## 2010-07-29 DIAGNOSIS — I2510 Atherosclerotic heart disease of native coronary artery without angina pectoris: Secondary | ICD-10-CM

## 2010-07-29 DIAGNOSIS — E875 Hyperkalemia: Secondary | ICD-10-CM

## 2010-07-29 DIAGNOSIS — E7800 Pure hypercholesterolemia, unspecified: Secondary | ICD-10-CM

## 2010-07-29 DIAGNOSIS — E785 Hyperlipidemia, unspecified: Secondary | ICD-10-CM

## 2010-07-29 DIAGNOSIS — I10 Essential (primary) hypertension: Secondary | ICD-10-CM

## 2010-07-29 LAB — BASIC METABOLIC PANEL
BUN: 11 mg/dL (ref 6–23)
CO2: 28 mEq/L (ref 19–32)
Calcium: 9.1 mg/dL (ref 8.4–10.5)
Chloride: 88 mEq/L — ABNORMAL LOW (ref 96–112)
Creatinine, Ser: 0.9 mg/dL (ref 0.4–1.5)
GFR: 91.4 mL/min (ref >60.00)
Glucose, Bld: 96 mg/dL (ref 70–99)
Potassium: 4.5 mEq/L (ref 3.5–5.1)
Sodium: 122 mEq/L — ABNORMAL LOW (ref 135–145)

## 2010-07-29 LAB — LIPID PANEL
Cholesterol: 187 mg/dL (ref 0–200)
HDL: 37.4 mg/dL — ABNORMAL LOW (ref >39.00)
Total CHOL/HDL Ratio: 5
Triglycerides: 228 mg/dL — ABNORMAL HIGH (ref 0.0–149.0)
VLDL: 45.6 mg/dL — ABNORMAL HIGH (ref 0.0–40.0)

## 2010-07-29 LAB — LDL CHOLESTEROL, DIRECT: Direct LDL: 121.6 mg/dL

## 2010-07-29 LAB — HEPATIC FUNCTION PANEL
ALT: 29 U/L (ref 0–53)
AST: 18 U/L (ref 0–37)
Albumin: 4.1 g/dL (ref 3.5–5.2)
Alkaline Phosphatase: 68 U/L (ref 39–117)
Bilirubin, Direct: 0.1 mg/dL (ref 0.0–0.3)
Total Bilirubin: 0.6 mg/dL (ref 0.3–1.2)
Total Protein: 7 g/dL (ref 6.0–8.3)

## 2010-07-30 ENCOUNTER — Other Ambulatory Visit (INDEPENDENT_AMBULATORY_CARE_PROVIDER_SITE_OTHER): Payer: BC Managed Care – PPO

## 2010-07-30 ENCOUNTER — Encounter: Payer: Self-pay | Admitting: Cardiology

## 2010-07-30 ENCOUNTER — Other Ambulatory Visit: Payer: Self-pay | Admitting: Cardiology

## 2010-07-30 DIAGNOSIS — Z79899 Other long term (current) drug therapy: Secondary | ICD-10-CM

## 2010-07-30 DIAGNOSIS — E871 Hypo-osmolality and hyponatremia: Secondary | ICD-10-CM

## 2010-07-30 LAB — BASIC METABOLIC PANEL
BUN: 9 mg/dL (ref 6–23)
CO2: 29 mEq/L (ref 19–32)
Calcium: 9.1 mg/dL (ref 8.4–10.5)
Chloride: 88 mEq/L — ABNORMAL LOW (ref 96–112)
Creatinine, Ser: 0.8 mg/dL (ref 0.4–1.5)
GFR: 100.24 mL/min (ref >60.00)
Glucose, Bld: 98 mg/dL (ref 70–99)
Potassium: 4.4 mEq/L (ref 3.5–5.1)
Sodium: 123 mEq/L — ABNORMAL LOW (ref 135–145)

## 2010-08-05 NOTE — Assessment & Plan Note (Signed)
Summary: yearly/sl  rs per pt-mj confirmed appt w/pt 06-23-10/mt   Primary Provider:  Tomi Bamberger   History of Present Illness: [name] is a pleasant gentleman who has a history of anterior myocardial function in December 2004, followed by drug-eluting stent to his LAD.  He also subsequently had drug-eluting stent to his right coronary artery and circumflex 5 days later.   A cardiac catheterization was  performed on December 05, 2008 for exertional dyspnea. There was concern that this was an anginal level. His ejection fraction was 35%. There was an ostial filling defect in the LAD but otherwise nonobstructive disease. IVUS was performed and subsequently the patient had a drug-eluting stent to the LAD. Last echocardiogram was performed in July of 2010 and showed an ejection fraction of 40-45%. This was an improvement from previous. He was seen by Dr. Ladona Ridgel and given that his LV function had improved ICD was felt not to be indicated. Abdominal ultrasound in February 2011 showed no aneurysm. Since I last saw him in Jan of 2011 the patient denies any dyspnea on exertion, orthopnea, PND, pedal edema, palpitations, syncope or chest pain.   Current Medications (verified): 1)  Coreg 25 Mg Tabs (Carvedilol) .... Take 1/2  Tablet By Mouth Twice A Day 2)  Hydrochlorothiazide 12.5 Mg Caps (Hydrochlorothiazide) .Marland Kitchen.. 1 Tab On Mon- Wed & Friday 3)  Plavix 75 Mg Tabs (Clopidogrel Bisulfate) .Marland Kitchen.. 1 Tablet By Mouth Once A Day 4)  Aspirin Ec 325 Mg Tbec (Aspirin) .... Take One Tablet By Mouth Daily 5)  Fluoxetine Hcl 20 Mg Caps (Fluoxetine Hcl) .Marland Kitchen.. 1 Tab By Mouth Once Daily 6)  Nitroglycerin 0.4 Mg Subl (Nitroglycerin) .... One Tablet Under Tongue Every 5 Minutes As Needed For Chest Pain---May Repeat Times Three 7)  Ventolin Hfa 108 (90 Base) Mcg/act Aers (Albuterol Sulfate) .... 2 Puffs Every 4 Hrs 8)  Vitamin D 1000 Unit Tabs (Cholecalciferol) .Marland Kitchen.. 1 Tab Two Times A Day 9)  Lisinopril 10 Mg Tabs (Lisinopril)  .Marland Kitchen.. 1 Tab Once Daily 10)  Terazosin Hcl 5 Mg Caps (Terazosin Hcl) .Marland Kitchen.. 1 Cap Once Daily 11)  Simvastatin 40 Mg Tabs (Simvastatin) .... One Daily 12)  Bactroban 2 % Oint (Mupirocin) .... Apply To Nares Twice Daily 13)  Multivitamins   Tabs (Multiple Vitamin) .Marland Kitchen.. 1 By Mouth Daily  Allergies (verified): 1)  ! * No Shellfish Allergy 2)  ! * No Ivp Dye Allergy 3)  ! * No Latex Allergy 4)  Amoxicillin (Amoxicillin) 5)  Percodan  Past History:  Past Medical History: ISCHEMIC CARDIOMYOPATHY (ICD-414.8) Class I congestive heart failure.  HYPERTENSION (ICD-401.9) HYPERLIPIDEMIA (ICD-272.4) MI in December 2004. Coronary artery disease arthritis  Past Surgical History: Arthroscopy of the right knee. Cataract removal in 2002.  Social History: Reviewed history from 12/02/2008 and no changes required. works as a Psychologist, occupational The patient is married.  [place]  He continues to smoke cigarettes unfortunately.  He drinks two to three  alcoholic beverages a day.      Review of Systems       Some pain in right hand from previous fracture but no fevers or chills, productive cough, hemoptysis, dysphasia, odynophagia, melena, hematochezia, dysuria, hematuria, rash, seizure activity, orthopnea, PND, pedal edema, claudication. Remaining systems are negative.   Vital Signs:  Patient profile:   55 year old male Height:      73 inches Weight:      179 pounds BMI:     23.70 Pulse rate:  53 / minute Resp:     16 per minute BP sitting:   117 / 78  (right arm)  Vitals Entered By: Marrion Coy, CNA (July 29, 2010 11:12 AM)  Physical Exam  General:  Well-developed well-nourished in no acute distress.  Skin is warm and dry.  HEENT is normal.  Neck is supple. No thyromegaly.  Chest is clear to auscultation with normal expansion.  Cardiovascular exam is regular rate and rhythm.  Abdominal exam nontender or distended. No masses palpated. Extremities show right hand in  cast neuro grossly intact    EKG  Procedure date:  07/29/2010  Findings:      Sinus rhythm with anterolateral T wave inversion.  Impression & Recommendations:  Problem # 1:  CAD (ICD-414.00) Continue aspirin, Plavix, beta blocker, ACE inhibitor and statin. His updated medication list for this problem includes:    Coreg 25 Mg Tabs (Carvedilol) .Marland Kitchen... Take 1/2  tablet by mouth twice a day    Plavix 75 Mg Tabs (Clopidogrel bisulfate) .Marland Kitchen... 1 tablet by mouth once a day    Aspirin Ec 325 Mg Tbec (Aspirin) .Marland Kitchen... Take one tablet by mouth daily    Nitroglycerin 0.4 Mg Subl (Nitroglycerin) ..... One tablet under tongue every 5 minutes as needed for chest pain---may repeat times three    Lisinopril 10 Mg Tabs (Lisinopril) .Marland Kitchen... 1 tab once daily  Problem # 2:  ISCHEMIC CARDIOMYOPATHY (ICD-414.8) Continue beta blocker and statin. His updated medication list for this problem includes:    Coreg 25 Mg Tabs (Carvedilol) .Marland Kitchen... Take 1/2  tablet by mouth twice a day    Hydrochlorothiazide 12.5 Mg Caps (Hydrochlorothiazide) .Marland Kitchen... 1 tab on mon- wed & friday    Plavix 75 Mg Tabs (Clopidogrel bisulfate) .Marland Kitchen... 1 tablet by mouth once a day    Aspirin Ec 325 Mg Tbec (Aspirin) .Marland Kitchen... Take one tablet by mouth daily    Nitroglycerin 0.4 Mg Subl (Nitroglycerin) ..... One tablet under tongue every 5 minutes as needed for chest pain---may repeat times three    Lisinopril 10 Mg Tabs (Lisinopril) .Marland Kitchen... 1 tab once daily  Problem # 3:  HYPERTENSION (ICD-401.9) Blood pressure controlled on present medications. Will continue. Check potassium and renal function. His updated medication list for this problem includes:    Coreg 25 Mg Tabs (Carvedilol) .Marland Kitchen... Take 1/2  tablet by mouth twice a day    Hydrochlorothiazide 12.5 Mg Caps (Hydrochlorothiazide) .Marland Kitchen... 1 tab on mon- wed & friday    Aspirin Ec 325 Mg Tbec (Aspirin) .Marland Kitchen... Take one tablet by mouth daily    Lisinopril 10 Mg Tabs (Lisinopril) .Marland Kitchen... 1 tab once daily     Terazosin Hcl 5 Mg Caps (Terazosin hcl) .Marland Kitchen... 1 cap once daily  His updated medication list for this problem includes:    Coreg 25 Mg Tabs (Carvedilol) .Marland Kitchen... Take 1/2  tablet by mouth twice a day    Hydrochlorothiazide 12.5 Mg Caps (Hydrochlorothiazide) .Marland Kitchen... 1 tab on mon- wed & friday    Aspirin Ec 325 Mg Tbec (Aspirin) .Marland Kitchen... Take one tablet by mouth daily    Lisinopril 10 Mg Tabs (Lisinopril) .Marland Kitchen... 1 tab once daily    Terazosin Hcl 5 Mg Caps (Terazosin hcl) .Marland Kitchen... 1 cap once daily  Problem # 4:  HYPERLIPIDEMIA (ICD-272.4) Continue statin. Check lipids and liver. His updated medication list for this problem includes:    Simvastatin 40 Mg Tabs (Simvastatin) ..... One daily  Orders: TLB-Lipid Panel (80061-LIPID) TLB-Hepatic/Liver Function Pnl (80076-HEPATIC)  Problem #  5:  TOBACCO ABUSE (ICD-305.1) Patient counseled on discontinuing.  Other Orders: TLB-BMP (Basic Metabolic Panel-BMET) (80048-METABOL)  Patient Instructions: 1)  Your physician wants you to follow-up in: ONE YEAR  You will receive a reminder letter in the mail two months in advance. If you don't receive a letter, please call our office to schedule the follow-up appointment.

## 2010-09-27 LAB — CBC
HCT: 34.4 % — ABNORMAL LOW (ref 39.0–52.0)
Hemoglobin: 11.7 g/dL — ABNORMAL LOW (ref 13.0–17.0)
MCHC: 34 g/dL (ref 30.0–36.0)
MCV: 90.2 fL (ref 78.0–100.0)
Platelets: 218 10*3/uL (ref 150–400)
RBC: 3.82 MIL/uL — ABNORMAL LOW (ref 4.22–5.81)
RDW: 13.3 % (ref 11.5–15.5)
WBC: 8.2 10*3/uL (ref 4.0–10.5)

## 2010-09-27 LAB — BASIC METABOLIC PANEL
BUN: 15 mg/dL (ref 6–23)
CO2: 25 mEq/L (ref 19–32)
Calcium: 8.1 mg/dL — ABNORMAL LOW (ref 8.4–10.5)
Chloride: 102 mEq/L (ref 96–112)
Creatinine, Ser: 1.15 mg/dL (ref 0.4–1.5)
GFR calc Af Amer: >60 mL/min (ref >60)
GFR calc non Af Amer: >60 mL/min (ref >60)
Glucose, Bld: 121 mg/dL — ABNORMAL HIGH (ref 70–99)
Potassium: 4 mEq/L (ref 3.5–5.1)
Sodium: 132 mEq/L — ABNORMAL LOW (ref 135–145)

## 2010-09-27 LAB — POCT I-STAT 3, VENOUS BLOOD GAS (G3P V)
Acid-base deficit: 6 mmol/L — ABNORMAL HIGH (ref 0.0–2.0)
Bicarbonate: 19.4 mEq/L — ABNORMAL LOW (ref 20.0–24.0)
O2 Saturation: 62 %
TCO2: 20 mmol/L (ref 0–100)
pCO2, Ven: 36.9 mmHg — ABNORMAL LOW (ref 45.0–50.0)
pH, Ven: 7.328 — ABNORMAL HIGH (ref 7.250–7.300)
pO2, Ven: 34 mmHg (ref 30.0–45.0)

## 2010-09-27 LAB — POCT I-STAT, CHEM 8
BUN: 22 mg/dL (ref 6–23)
Calcium, Ion: 1.14 mmol/L (ref 1.12–1.32)
Chloride: 103 mEq/L (ref 96–112)
Creatinine, Ser: 1 mg/dL (ref 0.4–1.5)
Glucose, Bld: 110 mg/dL — ABNORMAL HIGH (ref 70–99)
HCT: 39 % (ref 39.0–52.0)
Hemoglobin: 13.3 g/dL (ref 13.0–17.0)
Potassium: 4.4 mEq/L (ref 3.5–5.1)
Sodium: 132 mEq/L — ABNORMAL LOW (ref 135–145)
TCO2: 20 mmol/L (ref 0–100)

## 2010-09-27 LAB — POCT I-STAT 3, ART BLOOD GAS (G3+)
Acid-base deficit: 4 mmol/L — ABNORMAL HIGH (ref 0.0–2.0)
Bicarbonate: 20 mEq/L (ref 20.0–24.0)
O2 Saturation: 94 %
TCO2: 21 mmol/L (ref 0–100)
pCO2 arterial: 32.7 mmHg — ABNORMAL LOW (ref 35.0–45.0)
pH, Arterial: 7.395 (ref 7.350–7.450)
pO2, Arterial: 72 mmHg — ABNORMAL LOW (ref 80.0–100.0)

## 2010-11-02 NOTE — Assessment & Plan Note (Signed)
[facility]                            CARDIOLOGY OFFICE NOTE   NAME:BENNETTAndersen, Iorio                     MRN:          [id number]  DATE:01/03/2008                            DOB:          03-25-1956    Mr. Sem is seen today as an add on per request of Dr. Elana Alm.  He  is a patient of Dr. Ludwig Clarks.   The patient was seen at [facility] for weakness.  He  apparently had chills the previous night.  Subsequently, had an episode  of diarrhea.   I do not think any of these symptoms were referable to his heart.  He is  not having significant chest pain, PND, or orthopnea.  He has not had  lower extremity edema or syncope.  Apparently, Dr. Elana Alm thought, he  had EKG changes and sent him over to our office for an urgent consult.   However, I reviewed the patient's EKGs.  There are no acute changes;  however, he has had a previous anterolateral wall MI with biphasic T-  waves across the precordium.   As far as I can tell, there are no acute changes.   The patient does feel washed out, apparently he had lab work drawn.  He  currently is only on 12.5 of hydrochlorothiazide.  Hopefully, the lab  work concluded a BMET and we will see if he is prerenal or not.  In  looking over the patient's record, he has an ischemic cardiomyopathy.   His EF is only 34%.   He was referred to Dr. Ladona Ridgel for consideration of an AICD, which he  qualifies for.  However, the patient apparently did not want to  entertain the idea at this time.   When I asked the patient why, it was clear that it was because of his  job.  The patient has only had one job his whole life since 1974.  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  I explained to him that he does have an ischemic  cardiomyopathy that studies have shown, the defibrillator  would probably  improve both the quality and length of his life.  He would probably  qualify for BiV AICD.   I think it will be better for the patient to pursue disability and get  his AICD as apposed to not have an AICD in place with an ischemic  cardiomyopathy, because it would make him unable to work.  I encouraged  him to seek a hearing with the local disability chapter.   The patient's review of systems is otherwise negative and particularly,  he has not had any nausea, vomiting, or fever.   CURRENT MEDICATIONS:  1. Plavix 75 a day.  2. Multivitamins.  3. Aspirin a day.  4. Lisinopril 40 a day.  5. Zocor 40 a day.  6. Carvedilol 25 b.i.d.  7. Hydrochlorothiazide 12.5 a day.  8. Terazosin 5 mg nightly.  PHYSICAL EXAMINATION:  GENERAL:  Remarkable for a chronically ill-  appearing white male, in no distress.  He has nicotine on his breath.  VITAL SINGS:  His Blood pressure is 115/73, he is not postural; pulse 52  and regular; respiratory rate 14, afebrile; and weight 162.  HEENT:  Unremarkable.  NECK:  Carotids normal without bruit.  No lymphadenopathy, thyromegaly,  or JVP elevation.  LUNGS:  Clear, good diaphragmatic motion.  No wheezing.  HEART:  S1 and S2 with no significant murmur.  PMI, mildly increased.  ABDOMEN:  Benign.  Bowel sounds positive.  No AAA.  No tenderness.  No  epigastric pain.  No bruit.  No hepatosplenomegaly or hepatojugular  reflux.  EXTREMITIES:  Distal pulses intact.  No edema.  NEUROLOGIC:  Nonfocal.  SKIN:  Warm and dry.  MUSCULOSKELETAL:  No muscular weakness.   LABORATORY DATA:  EKGs were reviewed.  No acute changes from old EKGs.  Old anterior wall MI with continued biphasic anterolateral T-waves.   The patient also just had a stress Myoview study done on October 02, 2007.  He had an EF of 34% with akinesis at the anterior wall, no change from  2006 with a large scar.  Mild peri-infarct ischemia.   IMPRESSION:  1. Questionable EKG  changes that appears stable to me.  Old anterior      wall myocardial infarction.  Low-risk Myoview on October 02, 2007.      Not had any chest pain.  Continue medical therapy for coronary      artery disease.  2. Congestive heart failure, appears euvolemic.  Check lab work that      was done at [facility] today to see what his basic metabolic      panel is.  3. Probable viral illness with diarrhea and chills, currently does not      appear toxic.  Followup with [facility].  4. Question of automatic implantable cardioverter-defibrillator.      Again, I think it would be better for the patient to get his      defibrillator in place, in which case, I am sure, he would qualify      for disability.  Clearly, the disability last stated that if you      have only been trained to do a single job your whole life and you      are unable to do it due to a medical illness that he should      qualify, I will have the patient followup and discuss this with Dr.      Jens Som in the future.  5. Hyperglycemia in the setting of coronary artery disease and an old      myocardial infarction.  Continue Zocor, lipid, and liver profile in      6 months.     Noralyn Pick. Eden Emms, MD, [facility]  Electronically Signed    PCN/MedQ  DD: 01/03/2008  DT: 01/04/2008  Job #: [id number]

## 2010-11-02 NOTE — Discharge Summary (Signed)
[name], Jimmy Lewis              ACCOUNT NO.:  [id number]   MEDICAL RECORD NO.:  [id number]          PATIENT TYPE:  INP   LOCATION:  November 25, 2505                         FACILITY:  [facility]   PHYSICIAN:  Pricilla Riffle, MD, FACCDATE OF BIRTH:  1955/11/30   DATE OF ADMISSION:  12/05/2008  DATE OF DISCHARGE:  12/06/2008                               DISCHARGE SUMMARY   PROCEDURES:  1. Cardiac catheterization.  2. Coronary arteriogram.  3. Left ventriculogram  4. Right heart catheterization.  5. Percutaneous transluminal coronary angioplasty and drug-eluting      stent to the left anterior descending.   PRIMARY FINAL DISCHARGE DIAGNOSIS:  Coronary artery disease.   SECONDARY DIAGNOSES:  1. Ischemic cardiomyopathy with an ejection fraction of 35% by cath      this admission.  2. History of anterior wall myocardial infarction complicated by      sudden death in 2002-11-26, status post percutaneous transluminal      coronary angioplasty and stent to the right coronary artery and      circumflex.  3. Allergy or intolerance to AMOXICILLIN and PERCODAN.  4. Chronic class I systolic congestive heart failure.  5. Hypertension.  6. Hyperlipidemia.  7. Fatigue.  8. Family history of coronary artery disease in both parents and his      brother.  9. Remote history of tobacco use, quit in 11/26/2003.  10.Status post right knee surgery and cataract removal.   TIME AT DISCHARGE:  31 minutes.   HOSPITAL COURSE:  [name] is a 55 year old male with known coronary  artery disease.  He was having problems with fatigue, pallor, and  weakness.  He was seen in the office on June 16 and June 17.  Dr. Ladona Ridgel  evaluated him for possible ICD, and there was concern for exertional  dyspnea representing an anginal equivalent.  He was scheduled for right  and left heart cath, and came to the hospital for this on December 05, 2008.   His PA pressures were 20/4 with a mean of 12.  He had an ostial filling  defect, but  otherwise nonobstructive disease and an EF of 35%.  Dr.  Antoine Poche felt that intravascular ultrasound of the LAD was indicated,  and Dr. Riley Kill performed this.  PTCA and stent was indicated, and this  was performed, reducing the stenosis from 80% to 0 with a XIENCE drug-  eluting stent.  Mr. Mcmannis tolerated the procedure well.   On December 06, 2008, Mr. Messler was evaluated by Dr. Dietrich Pates.  He  ambulated with cardiac rehab and have no chest pain or shortness of  breath.  Dr. Tenny Craw felt he could be safely discharged to home with close  outpatient followup.   DISCHARGE INSTRUCTIONS:  1. His activity level is to be increased gradually with no driving for      2 days and no lifting for 2 weeks.  2. He is to call our office for problems with the cath site.  3. He is to stick to a low-fat and salt diet.  4. He is to follow up with Dr. Ladona Ridgel and  Dr. Jens Som as needed.   DISCHARGE MEDICATIONS:  1. Aspirin 325 mg daily.  2. Plavix 75 mg daily.  3. Coreg 25 mg one-half tab b.i.d.  4. Hydrochlorothiazide 12.5 mg daily.  5. Lisinopril 40 mg one-half tab daily.  6. Simvastatin 40 mg daily.  7. Terazosin 5 mg daily.  8. Ibuprofen 200 mg 2 tabs b.i.d. p.r.n.  9. Hydroxyzine 25 mg 1 or 2 tabs up to b.i.d. p.r.n.  10.Vicodin 5 mg p.r.n.  11.Lunesta nightly p.r.n.  12.Nitro 0.4 mg as needed.      Theodore Demark, PA-C      Pricilla Riffle, MD, [facility]  Electronically Signed    RB/MEDQ  D:  12/06/2008  T:  12/07/2008  Job:  [id number]   cc:   Olena Leatherwood [facility]

## 2010-11-02 NOTE — Assessment & Plan Note (Signed)
[facility]                            CARDIOLOGY OFFICE NOTE   NAME:Jimmy Lewis, Jimmy Lewis                     MRN:          [id number]  DATE:11/29/2006                            DOB:          February 11, 1956    PRIMARY CARE PHYSICIAN:  Jimmy Lewis, [facility].   HISTORY OF PRESENT ILLNESS:  Jimmy Lewis is a 55 year old gentleman who  suffered a large anterior myocardial infarction in December 2004. I  placed a drug-eluting stent in his LAD and 5 days later placed drug-  eluting stents in his RCA and circumflex. His EF has 35-45% since. 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 He has no angina, exertional  dyspnea, orthopnea, PND, edema, palpitations, syncope, or presyncope. In  short, he continues to do very well.   CURRENT MEDICATIONS:  1. Plavix 75 mg daily.  2. Multivitamin.  3. Aspirin 81 mg daily.  4. Lisinopril 40 mg daily.  5. Zocor 40 mg daily.  6. Carvedilol 25 mg twice daily.  7. HCTZ 12.5 mg daily.   HABITS:  Unfortunately back to smoking an occasional cigarette. He is  trying to quit entirely again.   PHYSICAL EXAMINATION:  GENERAL:  He is generally well-appearing in no  distress.  VITAL SIGNS:  Heart rate 53, blood pressure 116/75 and weight of 157  pounds.  NECK:  He has no jugular venous distention, thyromegaly or  lymphadenopathy.  LUNGS:  Respiratory effort is normal. Clear to auscultation.  CARDIAC:  He has a nondisplaced point of maximal cardiac impulse. There  is a regular rate and rhythm without murmur, S3 or rub. There is no S4.  ABDOMEN:  Soft, nondistended, nontender. There is no hepatosplenomegaly.  Bowel sounds are normal.  EXTREMITIES:  Warm without clubbing, cyanosis, edema or ulceration.  There is no midline abdominal pulsative mass.   Electrocardiogram  demonstrates sinus bradycardia at 53 beats per minute  with anterior infarct. There are persistent T wave inversions anteriorly  and  inferiorly. Electrocardiogram  is unchanged compared with one of  April 19, 2006.   IMPRESSION/RECOMMENDATIONS:  1. Coronary disease with prior anterior myocardial infarction and      ischemic cardiomyopathy. He is having no angina and is in [place]      Heart Association class 1. Ejection fraction of 35-45%. With drug-      eluting stents in all 3 major coronary vessels, will continue      aspirin and Plavix indefinitely.  2. Hypertension. Nicely controlled with the addition of      hydrochlorothiazide.  3. Tobacco abuse. Advised him to completely quit.  4. Hypercholesterolemia. Continue simvastatin. LDL was 65 and HDL 28      in November. LFTs were normal at that time. Will make no change in      his current medications. Instead emphasized smoking cessation.   The patient will followup with Dr. Olga Millers in 6 months' time.     Salvadore Farber, MD  Electronically Signed    WED/MedQ  DD: 11/29/2006  DT: 11/30/2006  Job #: [id number]

## 2010-11-02 NOTE — Cardiovascular Report (Signed)
NAMELELON, IKARD              ACCOUNT NO.:  [id number]   MEDICAL RECORD NO.:  [id number]          PATIENT TYPE:  INP   LOCATION:  2507                         FACILITY:  [facility]   PHYSICIAN:  Arturo Morton. Riley Kill, MD, FACCDATE OF BIRTH:  [birth year]   DATE OF PROCEDURE:  12/05/2008  DATE OF DISCHARGE:  12/06/2008                            CARDIAC CATHETERIZATION   PERCUTANEOUS INTERVENTION REPORT   INDICATIONS:  This gentleman is a 55 year old who earlier underwent  cardiac catheterization by Dr. Antoine Poche in the [facility] laboratory.  He had an  indwelling femoral artery and vein sheaths.  There was concern about the  ostium of the left anterior descending artery with some haziness and  irregularity proximal to the previously placed stent by Dr. Samule Ohm in  2005.  As a result, Dr. Antoine Poche felt that intravascular ultrasound  would be helpful in defining this area with the possibility of  percutaneous intervention.  We discussed the case with the patient and  his family and he was agreeable to proceed.   PROCEDURES:  1. Percutaneous stenting with a drug-eluting stent.  2. Intravascular ultrasound.   DESCRIPTION OF PROCEDURE:  Following informed consent and appropriate  pretreatment, the patient was brought to the catheterization laboratory  and prepped and draped in the usual fashion.  We double gloved and  prepped the groins carefully.  Sheaths were exchanged appropriately.  ACT was checked.  With the ACT being what it was, we elected to use  heparin only.  The patient was on previously utilized Plavix.  Following  this, the left coronary artery was engaged with the CLS 3.5 and a BMW  wire.  Intravascular ultrasound was then subsequently performed.  A  minimum lumen diameter at the ostium of the LAD was about 1.6 to 1.7,  and the picture was posted in the chart.  Following this, the decision  was made to recommend ostial stenting.  With this, the lesion was then  crossed and the stent  was very very carefully placed at the ostium.  XIENCE V 2.75 x 15 mm stent was then placed and deployed at 14  atmospheres.  Following this, a 3.0 x 12 [place] Quantum apex balloon was  taken into the vessel, and dilatations performed at 12 atmospheres, this  was followed by a slightly shorter balloon what was taken to 16  atmospheres throughout the course of the stent.  This was then finally  followed by a 3.25 x 6 Quantum apex balloon, which was postdilated at 15  atmospheres basically throughout the stent.  There was marked  improvement in the appearance of the artery, with preservation of the  ostium, and also a good overlap into the previously placed stent.  The  procedure was completed.  The sheath sewn into place.  He was taken to  the holding area in satisfactory clinical condition.   ANGIOGRAPHIC DATA:  1. Please see the diagnostic report by Dr. Antoine Poche.  2. The ostium of the LAD demonstrates an eccentric lesion.  This is      hazy coming right out of the ostium with perhaps up to 60-70%.  This extends into the previously placed stent.  Following balloon      dilatation, these areas were basically reduced to 0% residual      luminal narrowing.  There was no evidence of edge tear, and what      appeared to be good stent expansion.   CONCLUSION:  Successful percutaneous stenting of the ostium of the left  anterior descending artery.   DISPOSITION:  The patient will be treated with aspirin and Plavix.  Pending results DAPT, continuation of Plavix might be considered given  the overlapping stents, and ostial location.      Arturo Morton. Riley Kill, MD, [facility]  Electronically Signed     TDS/MEDQ  D:  02/26/2009  T:  02/27/2009  Job:  [id number]   cc:   [facility]

## 2010-11-02 NOTE — Assessment & Plan Note (Signed)
[name]                         ELECTROPHYSIOLOGY OFFICE NOTE   NAME:BENNETTMaxamus, [name]                     MRN:          [id number]  DATE:11/14/2007                            DOB:          [birth year]    [name] is referred today by Dr. Olga Millers for consideration  for prophylactic ICD insertion.  The patient has class I heart failure.  He is status post anterior myocardial infarction complicated by sudden  death back in 11-10-2002 at which time he underwent insertion of a stent.  He  had a repeat angioplasty and stenting to the RCA and circumflex as well.  The patient has been stable.  He was seen by Dr. Jens Som several weeks  ago and had a stress Myoview scan done which demonstrated his ejection  fraction is now 31%.  He has very minimal amounts of peri-infarct  ischemia with a large anterior apical and apical scar present.  The  patient is here now for additional evaluation.  He has never had  syncope.  He has no shortness of breath.  He is able to work on a  regular job (he works as a Psychologist, occupational).  He has no heart failure symptoms  whatsoever and denies anginal symptoms as well.   PAST MEDICAL HISTORY:  Is as noted above.  He also has history of  hypertension.  He has a history of dyslipidemia.  He has a history of  ongoing tobacco abuse.   FAMILY HISTORY:  His family history is positive for coronary disease.   SOCIAL HISTORY:  The patient is married.  He lives in [place].  He continues to smoke cigarettes unfortunately.  He drinks two to three  alcoholic beverages a day.   REVIEW OF SYSTEMS:  Is notable for very minimal amount of arthritic  complaints, otherwise he has been really quite stable.  All systems were  reviewed and negative except as noted above.   MEDICATIONS:  1. Include Plavix 75 a day.  2. Multiple vitamins.  3. Aspirin 81 a day.  4. Lisinopril 40 a day.  5. Zocor 40 a day.  6. Carvedilol 25 twice daily.  7.  HCTZ 12.5 daily.  8. Terazosin 5 mg daily at bedtime.   PHYSICAL EXAMINATION:  GENERAL:  On exam he is a pleasant well-  appearing, middle-aged man in no acute distress.  VITAL SIGNS:  Blood pressure was 111/73, the pulse 52 and regular,  respirations were 18.  HEENT:  Normocephalic and atraumatic.  Pupils equal and round.  Oropharynx moist.  Sclerae anicteric.  NECK:  Revealed no jugular venous distention.  No thyromegaly.  Trachea  is midline.  The carotids are 2+ and symmetric.  LUNGS:  Clear bilaterally to auscultation.  No wheezes, rales or rhonchi  are present.  No increased work of breathing.  CARDIOVASCULAR:  Revealed a regular rate and rhythm with normal S1 and  S2.  PMI was enlarged and laterally displaced.  There was a soft S4  gallop present.  ABDOMEN:  Soft, nontender, nondistended.  There was no organomegaly.  The bowel sounds were present.  No rebound  or guarding.  EXTREMITIES:  Demonstrate no cyanosis, clubbing, edema.  Pulses were 2+  symmetric.  NEUROLOGIC:  Alert and oriented x3.  His cranial nerves were intact.  Strength was 5/5 and symmetric.   IMPRESSION:  1. Ischemic cardiomyopathy.  2. Class I congestive heart failure.  3. Hypertension.  4. Ongoing tobacco abuse.   DISCUSSION:  I have discussed the treatment options with the patient.  The risks, benefits, goals and expectations of prophylactic ICD  insertion were discussed with him.  He would prefer not to have a  defibrillator at this time.  He will call us if he changes his mind and  would like to proceed with this.     Doylene Canning. Ladona Ridgel, MD  Electronically Signed    GWT/MedQ  DD: 11/14/2007  DT: 11/14/2007  Job #: [id number]   cc:   Tomi Bamberger, Dr

## 2010-11-02 NOTE — Assessment & Plan Note (Signed)
[facility]                            CARDIOLOGY OFFICE NOTE   NAME:BENNETTTraveion, Ruddock                     MRN:          [id number]  DATE:01/22/2008                            DOB:          27-Aug-1955    Mr. Reznik is a pleasant gentleman who has a history of anterior  myocardial function in December 2004, followed by drug-eluting stent to  his LAD.  He also subsequently had drug-eluting stent to his right  coronary artery and circumflex 5 days later.  His most recent Myoview  was performed on October 02, 2007.  At that time his ejection fraction was  34%.  There was a large scar affecting the anterior wall and the apex.  There was slight peri-infarct ischemia.  We referred the patient to Dr.  Ladona Ridgel for consideration of an ICD.  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  Since I saw him last, he denies  any dyspnea, chest pain, palpitations or syncope.  There is no pedal  edema.  He does complain of fatigue.   MEDICATIONS:  1. Plavix 75 mg p.o. daily.  2. Multivitamin daily.  3. Aspirin 81 mg p.o. daily.  4. Lisinopril 40 mg p.o. daily.  5. Zocor 40 mg p.o. daily.  6. Carvedilol 25 mg p.o. b.i.d.  7. HCTZ 12.5 mg p.o. daily.  8. Terazosin 5 mg p.o. at bedtime.   PHYSICAL EXAMINATION:  VITAL SIGNS:  Blood pressure of 142/86 and he  weighs 162 pounds.  His pulse is 46.  HEENT:  Normal.  NECK:  Supple.  CHEST:  Clear.  CARDIOVASCULAR:  Bradycardic rate, but a regular rhythm.  ABDOMEN:  No tenderness.  EXTREMITIES:  No edema.   DIAGNOSES:  1. Ischemic cardiomyopathy - the patient will continue on his aspirin,      Plavix, lisinopril, carvedilol and Zocor.  We discussed today the      importance of a defibrillator.  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  He would like  to discuss this further with Dr. Ladona Ridgel.      He may have the option of working in his present position without      welding.  However, he would be in the same building.  I will review      this with Dr. Ladona Ridgel and we will make a decision about whether he      would qualify.  I have also explained the risks of sudden cardiac      death and he understands.  2. Fatigue - we will decrease his Coreg to 12.5 mg p.o. b.i.d. as his      heart rate is in the 40s.  Hopefully, this will improve his      symptoms.  We will also check a TSH.  3. Hypertension - his blood pressure is adequately control on his  present medications.  Note, his most recent BMET from [employer]      showed a sodium of 129.  We will plan to repeat his BMET to see if      this has improved.  4. Tobacco abuse - I had long discussion with him concerning the      importance of discontinuing this.  5. Hyperlipidemia - he will continue on his statin.   I will see him back in 3 months.  In meantime, we will discuss with Dr.  Ladona Ridgel whether he may qualify for defibrillator and continue in his  present employment without welding.     Madolyn Frieze Jens Som, MD, [facility]  Electronically Signed    BSC/MedQ  DD: 01/22/2008  DT: 01/22/2008  Job #: [id number]   cc:   Olena Leatherwood [facility]

## 2010-11-02 NOTE — Cardiovascular Report (Signed)
NAMELAMIN, CHANDLEY NO.:  [id number]   MEDICAL RECORD NO.:  [id number]          PATIENT TYPE:  INP   LOCATION:  2507                         FACILITY:  [facility]   PHYSICIAN:  Rollene Rotunda, MD, FACCDATE OF BIRTH:  Jan 10, 1956   DATE OF PROCEDURE:  DATE OF DISCHARGE:                            CARDIAC CATHETERIZATION   PRIMARY CARE PHYSICIAN:  Kinnie Scales. Toni Arthurs, MD.   CARDIOLOGY:  Madolyn Frieze. Jens Som, MD, [facility]   PROCEDURE:  Left and right heart catheterization/coronary arteriography.   INDICATIONS:  Evaluate the patient with cardiomyopathy, dyspnea, and  previous coronary artery disease.   PROCEDURE NOTE:  Left heart catheterization performed via right femoral  artery, right heart catheterization performed via right femoral vein.  Both vessels are cannulated using the anterior wall puncture.  A #4-  [place] arterial sheath and a #7-French venous sheath were inserted via  the modified Seldinger technique.  Preformed Judkins and a pigtail  catheter were utilized.  The patient tolerated procedure well and left  the lab in stable condition.   RESULTS:  Hemodynamics RA mean 3, RV 24/5, pulmonary capillary wedge  pressure mean 5, PA 20/4 with a mean of 12, AO 85/52, LV 82/4.  Cardiac  output/cardiac index 4.1/2.1.  Coronaries, left main had luminal  irregularities.  The LAD had an ostial filling defect.  There was 40%  stenosis before the stent.  The stent was widely patent with diffuse  luminal irregularities.  There was distal apical nonobstructive diffuse  disease.  First and second diagonal were small.  Third diagonal was  moderate size to small normal.  The circumflex in the AV groove had  ostial 30-40% stenosis.  Mid obtuse marginal was large with a stent that  was widely patent with diffuse mild in-stent luminal irregularities.  The right coronary artery was a large dominant vessel.  There is  proximal to mid luminal irregularities.  The distal stent had mid in-  stent restenosis.  There is probably between 2 stents.  This was 25-30%  stenosis at most.  The left ventriculogram was obtained in the RAO  projection.  The EF was 35% with and/or apical hypokinesis and distal  dyskinesis.   CONCLUSION:  Three-vessel coronary artery disease with patent stents.  There is a LAD, filling defect.  The is severe LV dysfunction, which is  increased compared with the previous left ventriculogram.   PLAN:  The plan will be to IVUS the left anterior descending to further  understand this anatomy.  Further evaluation and treatment will be based  on this.      Rollene Rotunda, MD, [facility]  Electronically Signed    JH/MEDQ  D:  12/05/2008  T:  12/06/2008  Job:  [id number]   cc:   Onalee Hua L. Toni Arthurs, M.D.

## 2010-11-02 NOTE — Assessment & Plan Note (Signed)
[facility]                            CARDIOLOGY OFFICE NOTE   NAME:BENNETTZyir, Gassert                     MRN:          [id number]  DATE:09/20/2007                            DOB:          25-May-1956    Mr. Nepomuceno is a gentleman that has previously been followed by Dr.  Samule Ohm.  He is a 55 year old gentleman who suffered an anterior  myocardial infarction in December 2004.  At that time he had a drug-  eluting stent to his LAD.  He had drug-eluting stents placed to his  right coronary artery and circumflex 5 days later.  His ejection  fraction has been in the 35-45% range.  His most recent Myoview was  performed on November 22, 2004.  At that time, his ejection fraction was 41%.  There was a large prior mid and distal anterior as well as apical  infarct and a question of trivial peri-infarct ischemia. Since he was  last seen, there is no significant dyspnea, chest pain, palpitations or  syncope.  There is no pedal edema.  There is no claudication.   CURRENT MEDICATIONS:  1. Plavix 75 mg p.o. daily.  2. Multivitamin daily.  3. Aspirin 81 mg daily.  4. Lisinopril 40 mg p.o. daily.  5. Zocor 40 mg p.o. daily.  6. Carvedilol 25 mg p.o. b.i.d.  7. HCTZ 12.5 mg p.o. daily.  8. Terazosin 5 mg p.o. q.h.s.   PHYSICAL EXAM:  Blood pressure of 112/67 and his pulse is 52.  He weighs  163 pounds.  HEENT:  Normal.  NECK:  Supple with no bruits.  CHEST:  Clear.  CARDIOVASCULAR:  Regular rate and rhythm.  ABDOMEN:  No tenderness.  There is no bruit.  EXTREMITIES:  No edema.   His electrocardiogram shows a sinus bradycardia with a rate of 51.  There is lateral T-wave inversion.  It is unchanged from previous.   DIAGNOSIS:  1. Coronary artery disease with ischemic cardiomyopathy - he will      continue on his aspirin, Plavix, lisinopril, Zocor, and carvedilol.      We will plan to risk stratify with a stress Myoview.  If it shows      no ischemia then we will  continue with medical therapy.  We      discussed the importance of diet and exercise as well as      discontinuing his tobacco use.  2. Hypertension - his blood pressure is adequately controlled.  I will      check a BMET and follow his potassium and renal function.  3. Tobacco abuse - I again discussed the importance of discontinuing      this.  4. Hyperlipidemia - he will continue on his statin. Will check lipids      and liver.   We will see him back in 12 months.     Madolyn Frieze Jens Som, MD, [facility]  Electronically Signed    BSC/MedQ  DD: 09/20/2007  DT: 09/21/2007  Job #: [id number]

## 2010-11-05 NOTE — Discharge Summary (Signed)
NAME:  Jimmy Lewis, Jimmy Lewis                        ACCOUNT NO.:  [id number]   MEDICAL RECORD NO.:  [id number]                   PATIENT TYPE:  INP   LOCATION:  4715                                 FACILITY:  [facility]   PHYSICIAN:  Theodore Demark, P.A. [facility]            DATE OF BIRTH:  02/14/1956   DATE OF ADMISSION:  11/24/2003  DATE OF DISCHARGE:  11/26/2003                                 DISCHARGE SUMMARY   PROCEDURE:  1. Cardiac catheterization.  2. Coronary arteriogram.  3. Left ventriculogram.   HOSPITAL COURSE:  Jimmy Lewis is a 55 year old male with a history of  coronary artery disease who had an anterior MI in December 2004.  This was  associated with a ventricular fibrillation arrest and he had a Cypher stent  to his LAD as well as a staged Cypher stent to the circumflex and RCA. He  had an apical clot and has been on Coumadin with this. He did not feel well  on the day of admission and developed left-sided neck, shoulder, and upper  arm discomfort which was usual for him. He came to the emergency room where  he was admitted for further evaluation and treatment. Jimmy Lewis had a  history of a normal Cardiolite the day before his ventricular fibrillation  arrest and MI. He was therefore extremely reluctant to have another  Cardiolite. His enzymes were negative for MI and his Coumadin was held. It  was felt that he should be catheterized once his INR was low enough. By  January 26, 2004, his INR has decreased. Dr. Samule Ohm did a heart  catheterization which showed no critical coronary artery disease and disease  between 20% and 40% in the LAD, circumflex, and RCA, but all previously  placed stents were widely patent.  The patient's chest pain had resolved and  he remained pain free. He was ambulating without chest pain or shortness of  breath. He was considered stable for discharge on November 26, 2003, and is to  follow up in the office with Dr. Samule Ohm.   CONDITION ON DISCHARGE:   Improved.   DISCHARGE DIAGNOSES:  1. Chest pain, no critical lesions by catheter this admission.  2. Status post myocardial infarction in December 2004 with Cypher stent to     the left anterior descending and staged Cypher stent to the right     coronary artery and circumflex.  3. Ventricular fibrillation arrest in association with myocardial     infarction.  4. Left ventricular dysfunction with an ejection fraction of 45% by     discharge.  5. A 60-pack year history of tobacco use, quit in December.  6. Dyslipidemia.  7. Arthritis.  8. Status post arthroscopy of the right knee and cataract removal.  9. Intolerance to Percodan.  10.      Ongoing alcohol use with two to four beers every day.  11.      Allergy to AMOXICILLIN.  12.      Status post left ankle fracture.   DISCHARGE INSTRUCTIONS:  His activity level is to include no strenuous  activity for two days. He is to call the office for problems for cath site.  He is to have a low-fat diet. He is to limit alcohol. He is to follow up  with Dr. Samule Ohm in the office, will call, and is to follow up with Dr.  Hal Hope as scheduled.   DISCHARGE MEDICATIONS:  1. Coumadin has been discontinued.  2. Plavix 75 mg daily.  3. Wellbutrin 150 mg bid  4. Lipitor 80 mg daily.  5. Lisinopril 40 mg daily.  6. Coreg 25 mg b.i.d.  7. Aspirin 81 mg daily.  8. Multivitamins as prior to admission.  9. Nitroglycerin sublingual p.r.n.                                                Theodore Demark, P.A. [facility]    RB/MEDQ  D:  11/26/2003  T:  11/27/2003  Job:  [id number]   cc:   Clydie Braun L. Hal Hope, M.D.  [address] [place]  [place] [address]  Fax: [phone]   Salvadore Farber, M.D. [facility]  [address]  [address]  [place]  [place] [address]

## 2010-11-05 NOTE — H&P (Signed)
NAME:  Jimmy Lewis, Jimmy Lewis                        ACCOUNT NO.:  [id number]   MEDICAL RECORD NO.:  [id number]                   PATIENT TYPE:  INP   LOCATION:  4715                                 FACILITY:  [facility]   PHYSICIAN:  Rollene Rotunda, M.D.                DATE OF BIRTH:  24-Dec-1955   DATE OF ADMISSION:  11/24/2003  DATE OF DISCHARGE:                                HISTORY & PHYSICAL   PRIMARY:  Dr. Lenord Carbo.   REASON FOR PRESENTATION:  Evaluate patient with neck and shoulder  discomfort.   HISTORY OF PRESENT ILLNESS:  The patient is a pleasant 55 year old gentleman  with neck and arm discomfort today at work.  His past history is notable for  an anterior myocardial infarction in December.  He apparently had a  ventricular fibrillation arrest.  He had urgent CYPHER stenting of an LAD  lesion.  He subsequently had elective stenting of the circumflex and right  coronary lesions with apparent CYPHER stents as well.  He has done  relatively well since being home.  He has had nausea routinely with exercise  and when he goes to bed at night.  This morning, he went to work not feeling  well, though he cannot be more specific with this.  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  He does not think this is like his previous angina.  Because he did not feel  well with this, he stopped what he was doing.  He went home, which is  distinctly unusual for him.  There, his daughter called EMS.  There is no  record from them.  They said they didn't like what they saw.  He was given  nitroglycerin by them.  They wanted him to come to the ER but he refused.  They left and when his wife got home later, she forced him to come to the  ER.  At this point, he is not having any discomfort.  He denies any chest  discomfort, neck discomfort, arm discomfort, activity-induced nausea,  vomiting or excessive diaphoresis.  He has had no palpitations,  no  presyncope or syncope.   PAST MEDICAL HISTORY:  Past medical history of:  1. Coronary artery disease, as described, reduced ejection fraction     (discharge note reports it to be 45%; at the time of admission, it was     down to 25%), apical clot with chronic Coumadin therapy.  2. Daily 2-3 beers.  3. Previous tobacco abuse (he quit in December!).  4. Dyslipidemia.  5. Arthritis.   PAST SURGICAL HISTORY:  1. Arthroscopy of the right knee.  2. Cataract removal in 2002.   ALLERGIES:  PERCODAN causes sleepiness and erratic breathing.   MEDICATIONS:  1. Coumadin.  2. Plavix 75 mg daily.  3. Multivitamin.  4. Lisinopril 20 mg daily.  5. __________  25 mg q.12  h.  6. Budeprion SR 150 mg q.12 h.  7. Lipitor 80 mg daily.   SOCIAL HISTORY:  The patient smoked 2 packs per day for 30 years but quit in  December.  He drinks alcohol as above.  He has been married for 30 years.  He has 1 daughter and 1 grandson.  He works Youth worker.   FAMILY HISTORY:  Family history is contributory for early coronary artery  disease in his father, brother and mother.   REVIEW OF SYSTEMS:  Review of systems is as stated in the HPI and otherwise  negative for other systems.   PHYSICAL EXAMINATION:  GENERAL:  The patient is in no distress.  VITAL SIGNS:  Blood pressure 131/81, heart rate 55 and regular, afebrile.  HEENT:  Eye unremarkable; pupils equal, round and reactive to light; fundi  not visualized.  Oral mucosa unremarkable.  NECK:  No jugular venous distention, wave form within normal limits, carotid  upstroke brisk and symmetric, no bruits, no thyromegaly.  LYMPHATICS:  No cervical, axillary or inguinal adenopathy.  LUNGS:  Lungs clear to auscultation bilaterally.  BACK:  No costovertebral angle tenderness.  CHEST:  Unremarkable.  HEART:  PMI not displaced or sustained; S1 and S2 within normal limits; no  S3, no S4, no murmurs.  ABDOMEN:  Abdomen flat; positive bowel sounds, normal in  frequency and  pitch; no bruits, no rebound, no guarding, no midline pulsatile mass, no  hepatosplenomegaly, no splenomegaly.  SKIN:  No rashes, no nodules.  EXTREMITIES:  Pulses 2+; no edema, no cyanosis, no clubbing.  NEUROLOGIC:  Oriented to person, place and time; cranial nerves II-XII  grossly intact; motor grossly intact.   ACCESSORY CLINICAL DATA:  EKG:  Sinus rhythm, rate 54, axis within normal  limits, anterior T wave inversions, no previous EKGs for comparison.   IMPRESSION AND RECOMMENDATIONS:  1. Neck/shoulder discomfort:  This is worrisome for unstable angina.  Given     the fact that the patient had a negative stress perfusion study prior to     his myocardial infarction, they are very leery about this as a diagnostic     procedure.  I think his pre-test probability for obstructive coronary     disease is relatively high.  Therefore, I would suggest cardiac     catheterization.  They very much would like to proceed this way.  They     understand completely the risks and benefits.  We will rule him out for     myocardial infarction.  We will start heparin if his INR is below 2.  He     will continue on his other medications as listed.  2. Apical clot:  He has been on the Coumadin for 6 months; he can     discontinue this.  3. Dyslipidemia:  He will continue on Lipitor.                                                Rollene Rotunda, M.D.    JH/MEDQ  D:  11/24/2003  T:  11/25/2003  Job:  [id number]   cc:   Lenord Carbo, M.D.

## 2010-11-05 NOTE — H&P (Signed)
NAME:  Jimmy Lewis, Jimmy Lewis                        ACCOUNT NO.:  [id number]   MEDICAL RECORD NO.:  [id number]                   PATIENT TYPE:  INP   LOCATION:  3702                                 FACILITY:  [facility]   PHYSICIAN:  Noelle C. Merilynn Finland, M.D.           DATE OF BIRTH:  [birth year]   DATE OF ADMISSION:  05/21/2003  DATE OF DISCHARGE:                                HISTORY & PHYSICAL   PRIMARY CARE PHYSICIAN:  [facility]   CHIEF COMPLAINT:  Chest pain.   HISTORY OF PRESENT ILLNESS:  A 55 year old white male who was sent from his  primary care office today for chest pain to rule out unstable angina.  The  patient presented to his primary care office early on the afternoon of  admission with substernal chest pain that lasted for 15 to 20 minutes total,  severe, associated with nausea, diaphoresis, shortness of breath and  bilateral arm tingling, but no radiation of pain.  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  The patient drove to his primary care office, and  there he received Coreg, Altace and an aspirin.  His chest pain began  resolving during his drive to the office but resolved completely while at  the office. He did not ever receive nitroglycerin.  He was brought to the  [facility] emergency room by EMS and is currently pain-free.  He  does report that he has had about four episodes of the same type of chest  pain over the last year or so, usually nonexertional, lasting about 10  minutes.   REVIEW OF SYSTEMS:  A 14-point review of systems is unremarkable except for  heartburn which the patient says is very different from his chest pain today  and is always relieved by TUM's as well as a dry cough each morning for four  months and decreased appetite today at lunch time.   PAST MEDICAL HISTORY/PAST SURGICAL HISTORY:  1. Arthroscopy of the right knee in November of 2003 for torn meniscus and     chondromalacia by Dr. Lubertha Basque. Dal[place].  2. Eye surgery for cataract removal in 2002.  3. Heart trouble secondary to a weak valve when the patient's was in his     55's for which he took digoxin and quinidine x2 years.  This took place     in [place].  4. 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   ALLERGIES:  PERCODAN caused sleepiness and erratic breathing.   MEDICATIONS:  1. Mobic.  2. Tylenol.   SOCIAL HISTORY:  He has smoked two packs per day x30 years.  He drinks three  to six beers daily but at most three six-packs in one day.  He denies drug  use.  He has been married for 28 years.  He has one daughter and one  grandson.  He works in Youth worker.   FAMILY HISTORY:  Very significant for coronary  disease.  Father with first  MI at age 57.  Died of an MI at age 57.  Brother had first MI in his 57's  and is status post CABG.  Maternal grandfather with a CVA in the 57's.  Two  uncles with heart attacks in their 57's, one with a CVA in his 57's.  Mother  had coronary bypass in her 57's and also breast cancer and diabetes type 2.   PHYSICAL EXAMINATION:  VITAL SIGNS:  Afebrile.  Pulse ranging from the low  50's, low 60's.  Respirations 20-25, blood pressure 140's to 150's over  80's, 94% on room air.  GENERAL:  No apparent distress, alert and oriented x4.  HEENT:  Unremarkable.  NECK:  No JVD, no lymphadenopathy, no bruits.  CARDIOVASCULAR:  Regular rate and rhythm, no murmurs, rubs or gallops.  LUNGS:  Clear to auscultation bilaterally.  No wheezes, rales or rhonchi.  ABDOMEN:  Soft, nontender, nondistended.  No hepatosplenomegaly.  EXTREMITIES:  No clubbing, cyanosis or edema.  There is 2+ dorsalis pedis  pulses.  Strength and deep tendon reflexes intact and symmetric.  BACK:  No spine or CVA tenderness.  RECTAL:  Normal tone, smooth prostate, guaiac negative.   LABORATORY DATA:  Three sets of cardiac enzymes including myoglobin, CK-MB  and troponin one hour apart each are all negative.  WBC is 8.6, hemoglobin  13.9,  platelets 209.  ISTAT unremarkable.  Creatinine 1.0.   Chest x-ray:  No acute disease, question chronic obstructive pulmonary  disease, hyperinflated.   EKG:  Multiple EKG's done today showed normal sinus rhythm, rate in the 50's  or low 60's, prolonged QTC up to 454, and progressing T wave inversion in  leads 3, aVF and V5 through V6.  T-wave inversion is new compared to an old  EKG in 2003.   ASSESSMENT/PLAN:  1. A 55 year old white male with an episode of chest pain today, here for     rule out myocardial infarction. Cardiovascular:  Admit to a telemetry bed     to rule out myocardial infarction, cycle enzymes, Oxygen, p.r.n.     nitroglycerin and morphine.  We will start heparin if chest pain returns     or EKG changes evolve.  Continue aspirin and low-dose beta-blocker.     Check TSH and fasting lipid panel.  Consult cardiology in the morning for     a Cardiolite versus catheterization, given significant cardiac history.     Coronary risk factors are a significant family history, tobacco abuse,     likely undiagnosed hypertension which will need to be followed and     treated.  2. Tobacco abuse.  Counsel cessation.  Nicotine patch here in the hospital.     The patient may need outpatient pulmonary function testing to evaluate     for diagnosis of chronic obstructive pulmonary disease.  3. Alcohol abuse. Watch for withdrawal while he is an inpatient, and will     have a low threshold to start a benzodiazepine taper.                                                Noelle C. Merilynn Finland, M.D.    NCR/MEDQ  D:  05/22/2003  T:  05/22/2003  Job:  [id number]   cc:   [facility]

## 2010-11-05 NOTE — Cardiovascular Report (Signed)
NAME:  Jimmy Lewis, Jimmy Lewis                        ACCOUNT NO.:  [id number]   MEDICAL RECORD NO.:  [id number]                   PATIENT TYPE:  INP   LOCATION:  4715                                 FACILITY:  [facility]   PHYSICIAN:  Salvadore Farber, M.D. [facility]         DATE OF BIRTH:  24-Aug-1955   DATE OF PROCEDURE:  11/26/2003  DATE OF DISCHARGE:  11/26/2003                              CARDIAC CATHETERIZATION   PROCEDURE:  Coronary angiography.   INDICATIONS:  [name] is a 55 year old gentleman status post anterior  myocardial infarction in December with stenting of the LAD as well as  subsequently the circumflex and RCA.  He now presents with an episode of  approximately two hours of chest discomfort occurring at rest.  He was ruled  out for myocardial infarction by serial enzymes and electrocardiograms.  He  is referred for diagnostic angiography.   PROCEDURAL TECHNIQUE:  Informed consent was obtained.  Under 1% lidocaine  local anesthesia, a 6 French sheath was placed in the right common femoral  artery using the modified Seldinger technique.  Diagnostic angiography was  performed using JL4 and JR4 catheters.  Left heart catheterization and  ventriculography were not done due to patient's previously-known left  ventricular thrombus.  At the conclusion of the procedure, the arteriotomy  was closed using a 6 [place] Angioseal device.  The patient tolerated the  procedure well and was transferred to the holding room in stable condition.   COMPLICATIONS:  None.   FINDINGS:  1. Left main:  Angiographically normal.  2. LAD:  Large vessel giving rise to two small and one large diagonal     branch.  There is a 30% stenosis of the proximal vessel.  The stent in     the midvessel was widely patent.  3. Circumflex:  Moderate-sized vessel giving rise to two obtuse marginals.     There is a 40% stenosis at the ostium, a 20% stenosis of the midvessel     just before a stent.  The stent is widely  patent.  4. RCA:  A moderate-sized, dominant vessel.  There is a 30-40% stenosis of     the proximal vessel.  The stent in the distal vessel is widely patent.   IMPRESSION/RECOMMENDATIONS:  [name] has nonobstructive coronary  disease.  I suspect a noncardiac etiology to his most recent chest  discomfort.  Will plan on discharge home.  Will stop his Coumadin and  continue aspirin and Plavix.                                               Salvadore Farber, M.D. [facility]    WED/MEDQ  D:  11/26/2003  T:  11/27/2003  Job:  [id number]

## 2010-11-05 NOTE — Op Note (Signed)
NAME:  Jimmy Lewis, Jimmy Lewis                        ACCOUNT NO.:  [id number]   MEDICAL RECORD NO.:  [id number]                   PATIENT TYPE:  AMB   LOCATION:  DSC                                  FACILITY:  [facility]   PHYSICIAN:  Lubertha Basque. Jerl Santos, M.D.             DATE OF BIRTH:  04/21/1956   DATE OF PROCEDURE:  05/07/2002  DATE OF DISCHARGE:                                 OPERATIVE REPORT   PREOPERATIVE DIAGNOSIS:  Right knee torn medial meniscus.   POSTOPERATIVE DIAGNOSIS:  Right knee torn medial meniscus.  Right knee  chondromalacia patellofemoral joint.   PROCEDURE:  1. Right knee partial medial meniscectomy.  2. Right knee chondroplasty patellofemoral compartment.   ANESTHESIA:  Knee block MAC.   SURGEON:  Lubertha Basque. Jerl Santos, M.D.   ASSISTANT:  Lindwood Qua, P.A.   INDICATIONS FOR PROCEDURE:  The patient is a 55 year old man with about a  month of right knee difficulty.  His knee locks and he has to twist it to  unlock it. This has become problematic for him at work and at home.  He is  offered an arthroscopy. The procedure was discussed with the patient and  informed operative consent was obtained after discussion of possible  complications of reaction to anesthesia, and infection.   DESCRIPTION OF PROCEDURE:  The patient was taken to the operating room where  knee block was applied along with MAC.  He was positioned supine and prepped  and draped in the usual sterile fashion.  After administration of  preoperative IV antibiotics, an arthroscopy of the right knee was performed  through two inferior portals.  Suprapatellar pouch was benign while the  patellofemoral joint exhibited some mild chondromalacia addressed with a  brief chondroplasty.  His problem within the medial compartment where he had  a displaceable tear of the middle and posterior horns of the medial  meniscus.  About a 15% partial medial meniscectomy was required back to  stable tissue.  He had no  degenerative changes in this compartment.  The ACL  and PCL were intact. The lateral compartment was completely benign. The knee  was thoroughly irrigated at the end of the case followed by placement of  Marcaine with epinephrine and morphine. Adaptic was placed over the portals  followed by a dry gauze and a loose Ace wrap.  Estimated blood loss and  intraoperative fluids can be obtained from anesthesia records.   DISPOSITION:  The patient was taken to the recovery room in stable  condition.  Plans were for him to go home the same day and to follow up in  the office in less than a week.  I will contact him by phone tonight.  Lubertha Basque Jerl Santos, M.D.    PGD/MEDQ  D:  05/07/2002  T:  05/07/2002  Job:  [id number]

## 2010-11-05 NOTE — Assessment & Plan Note (Signed)
[facility]                            CARDIOLOGY OFFICE NOTE   NAME:BENNETTJayvan, Mcshan                     MRN:          [id number]  DATE:04/19/2006                            DOB:          September 10, 1955    PRIMARY CARE PHYSICIAN:  Tomi Bamberger, [facility].   HISTORY OF PRESENT ILLNESS:  Mr. Woolstenhulme is a 55 year old gentleman who  suffered a large anterior myocardial infarction in December 2004.  I  placed a drug-eluting stent in his left anterior descending artery and 5  days later did a staged intervention on his right coronary artery and  circumflex.  His ejection fraction has been 35-45% since.  He continues  to do well, working as a Psychologist, occupational, without angina, exertional dyspnea,  orthopnea, PND, edema, palpitations, syncope, and presyncope.   His current medications are:  1. Plavix 75 mg per day.  2. Multivitamin.  3. Coreg CR 80 mg per day.  4. Aspirin 81 mg per day.  5. Lisinopril 40 mg per day.  6. Zocor 40 mg per day.   PHYSICAL EXAMINATION:  He is generally well-appearing in no distress  with heart rate 48, blood pressure 130/82 and weight of 170 pounds.  He  has no jugular venous distention, no thyromegaly.  Lungs are clear to  auscultation.  He has a nondisplaced point of maximal cardiac impulse.  There is a regular rate and rhythm without murmur, S3 or rub.  There is  a soft S4.  The abdomen is soft, nondistended, nontender.  There is no  hepatosplenomegaly.  Bowel sounds are normal.  There is no midline  pulsatile mass.  The extremities are warm without clubbing, cyanosis,  edema, or ulceration.   Electrocardiogram demonstrates sinus bradycardia at 48 beats per minute  with old anterior myocardial infarction and minor nonspecific ST-T  abnormality.   IMPRESSION/RECOMMENDATIONS:  1. Prior anterior myocardial infarction with ischemic cardiomyopathy:      No angina.  EF 35-45%.  Continue aspirin and Plavix  indefinitely,      given his drug-eluting stents in multiple vessels.  To minimize      cost, will switch from long-acting carvedilol to carvedilol 25 mg      b.i.d.  2. Hypertension:  Blood pressure a bit higher than I would like.  Will      add hydrochlorothiazide 12.5 mg per day.  3. Prior tobacco abuse:  Remains off tobacco.  I congratulated him on      this.  4. Hypercholesterolemia:  Continue Zocor.  Check fasting lipids and      LFTs.    Salvadore Farber, MD  Electronically Signed   WED/MedQ  DD: 05/30/2006  DT: 05/31/2006  Job #: [id number]

## 2010-11-05 NOTE — Discharge Summary (Signed)
NAME:  Jimmy Lewis, Jimmy Lewis                        ACCOUNT NO.:  [id number]   MEDICAL RECORD NO.:  [id number]                   PATIENT TYPE:  INP   LOCATION:  3702                                 FACILITY:  [facility]   PHYSICIAN:  Pearlean Brownie, M.D.            DATE OF BIRTH:  September 26, 1955   DATE OF ADMISSION:  05/21/2003  DATE OF DISCHARGE:  05/22/2003                                 DISCHARGE SUMMARY   CONSULTANTS:  None.   DISCHARGE DIAGNOSES:  1. Stable angina.  2. Chronic obstructive pulmonary disease, subclinical at this time.  3. Tobacco abuse.  4. Alcohol abuse.   DISCHARGE MEDICATIONS:  Aspirin 81 mg p.o. daily.   HISTORY OF PRESENTING ILLNESS:  This is a 55 year old white male who was  sent to [facility] from his primary care Zandria Woldt's office today  for chest pain.  The patient had presented to his primary care Yesli Vanderhoff on  the day of admission with a 20-minute history of substernal chest pain  associated with nausea, diaphoresis, shortness of breath and bilateral arm  tingling.  The patient said the pain was severe, it came on during exertion  and resolved after rest.  The patient states he has had about four of these  episodes over the last year, none of which required a visit to the hospital.  The patient denied headaches, does admit to heartburn and states that this  episode is a different type of pain.  The patient also admits to a dry cough  in the morning for the last four months.   HOSPITAL COURSE:  CHEST PAIN:  The patient was asymptomatic at the time of  arrival to the emergency department.  He had an EKG which showed normal  sinus rhythm, corrected Q-T interval of 454, questionable T wave inversion  in leads III, aVF and V5 and V6, otherwise, unremarkable.  He had serial  enzymes, myoglobin of 57.7, CK-MB 1.5, troponin less than 0.5; second set --  myoglobin 43.3, CK-MB 1.4, troponin less than 0.05; third set -- myoglobin  36.8, CK-MB 2.8, troponin  less than 0.05.  White count 8.6, hemoglobin 13.9  and platelets 209,000.  Sodium 136, potassium 4.1, chloride 105, bicarb 24,  BUN 14, creatinine 10, glucose 95.  The patient had a chest x-ray which  showed hyperinflation but no acute disease.  His vitals were normal with  temperature of 99.4, pulse of 62, respirations 20 to 30, BP 140s to 150s  over 80s to 90s, saturating 95% on 2 L.  The patient remained pain-free  during his stay.  Remainder of cardiac enzymes were negative.  The patient  did not have an oxygen requirement.  He was fecal occult-blood-negative.  His lipid panel showed cholesterol of 168, triglycerides 166, HDL 36, LDL  99.  Complete metabolic panel performed was within normal limits.  Repeat  CBC showed hemoglobin 14.1, platelets 224,000, white count 8.  The patient  was maintained on a beta blocker and aspirin while in house.  He never  required the use of nitroglycerin.  He was also maintained on a nicotine  patch for tobacco abuse.  He did not show signs of alcohol withdrawal during  his stay.  His history and physical were consistent with a diagnosis of  stable angina and his rule-out-MI workup was negative, therefore, decision  was made to schedule the patient for outpatient exercise stress test; this  was done with [facility], Dr. Willa Rough; the patient is scheduled  for Monday, May 26, 2003, at 10:30.   DISCHARGE MEDICATIONS:  He will go home on aspirin 81 mg p.o. daily, will  not go home on a beta blocker as the use of this medication may interfere  with his exercise stress test which is scheduled only four days from now.   SPECIAL DISCHARGE INSTRUCTIONS:  The patient was counseled about the need to  quit smoking and also to seek help for use of alcohol.   FOLLOWUP:  The patient's primary care Kellyjo Edgren is not located here in  [place]; as such, his cardiac condition will be followed by Dr. Myrtis Ser  after the exercise stress test and the patient  will return to his primary  care Sarahbeth Cashin for followup.      Franklyn Lor, MD                            Pearlean Brownie, M.D.    TD/MEDQ  D:  05/22/2003  T:  05/22/2003  Job:  [id number]   cc:   Willa Rough, M.D.   Pearlean Brownie, M.D.  [address] [place]  [place] [address]  Fax: [phone]

## 2010-12-28 ENCOUNTER — Telehealth: Payer: Self-pay | Admitting: Cardiology

## 2010-12-28 NOTE — Telephone Encounter (Signed)
Per pt wife has question regarding  Medication hydrochlorothiazide .

## 2010-12-28 NOTE — Telephone Encounter (Signed)
Wife calling wanting to know if pt is still on Hydoxyline HCl (Vistaril) informed wife that the medication is not on the pts list of medications from last office visit in February 2012.  She stated understanding and that she didn't think he was taking it anymore.

## 2010-12-29 ENCOUNTER — Inpatient Hospital Stay (HOSPITAL_COMMUNITY)
Admission: EM | Admit: 2010-12-29 | Discharge: 2011-01-01 | DRG: 312 | Disposition: A | Discharge status: Home / Self Care | Payer: Self-pay | Attending: Internal Medicine | Admitting: Internal Medicine

## 2010-12-29 ENCOUNTER — Emergency Department (HOSPITAL_COMMUNITY): Payer: Self-pay

## 2010-12-29 DIAGNOSIS — I509 Heart failure, unspecified: Secondary | ICD-10-CM | POA: Diagnosis present

## 2010-12-29 DIAGNOSIS — Z8674 Personal history of sudden cardiac arrest: Secondary | ICD-10-CM

## 2010-12-29 DIAGNOSIS — F17200 Nicotine dependence, unspecified, uncomplicated: Secondary | ICD-10-CM | POA: Diagnosis present

## 2010-12-29 DIAGNOSIS — I2510 Atherosclerotic heart disease of native coronary artery without angina pectoris: Secondary | ICD-10-CM | POA: Diagnosis present

## 2010-12-29 DIAGNOSIS — I951 Orthostatic hypotension: Principal | ICD-10-CM | POA: Diagnosis present

## 2010-12-29 DIAGNOSIS — Z7982 Long term (current) use of aspirin: Secondary | ICD-10-CM

## 2010-12-29 DIAGNOSIS — I2589 Other forms of chronic ischemic heart disease: Secondary | ICD-10-CM | POA: Diagnosis present

## 2010-12-29 DIAGNOSIS — I10 Essential (primary) hypertension: Secondary | ICD-10-CM | POA: Diagnosis present

## 2010-12-29 DIAGNOSIS — F341 Dysthymic disorder: Secondary | ICD-10-CM | POA: Diagnosis present

## 2010-12-29 DIAGNOSIS — Z9861 Coronary angioplasty status: Secondary | ICD-10-CM

## 2010-12-29 DIAGNOSIS — E871 Hypo-osmolality and hyponatremia: Secondary | ICD-10-CM | POA: Diagnosis present

## 2010-12-29 DIAGNOSIS — I5022 Chronic systolic (congestive) heart failure: Secondary | ICD-10-CM | POA: Diagnosis present

## 2010-12-29 DIAGNOSIS — I252 Old myocardial infarction: Secondary | ICD-10-CM

## 2010-12-29 DIAGNOSIS — E785 Hyperlipidemia, unspecified: Secondary | ICD-10-CM | POA: Diagnosis present

## 2010-12-29 DIAGNOSIS — Z888 Allergy status to other drugs, medicaments and biological substances status: Secondary | ICD-10-CM

## 2010-12-29 DIAGNOSIS — N400 Enlarged prostate without lower urinary tract symptoms: Secondary | ICD-10-CM | POA: Diagnosis present

## 2010-12-29 DIAGNOSIS — Z8249 Family history of ischemic heart disease and other diseases of the circulatory system: Secondary | ICD-10-CM

## 2010-12-29 LAB — PROTIME-INR
INR: 0.98 (ref 0.00–1.49)
Prothrombin Time: 13.2 seconds (ref 11.6–15.2)

## 2010-12-29 LAB — BASIC METABOLIC PANEL
BUN: 11 mg/dL (ref 6–23)
CO2: 25 mEq/L (ref 19–32)
Calcium: 9.1 mg/dL (ref 8.4–10.5)
Creatinine, Ser: 0.77 mg/dL (ref 0.50–1.35)
GFR calc non Af Amer: >60 mL/min (ref >60)
Glucose, Bld: 163 mg/dL — ABNORMAL HIGH (ref 70–99)

## 2010-12-29 LAB — DIFFERENTIAL
Basophils Absolute: 0 10*3/uL (ref 0.0–0.1)
Basophils Relative: 0 % (ref 0–1)
Eosinophils Absolute: 0.4 10*3/uL (ref 0.0–0.7)
Eosinophils Relative: 4 % (ref 0–5)
Lymphocytes Relative: 18 % (ref 12–46)
Lymphs Abs: 1.7 10*3/uL (ref 0.7–4.0)
Monocytes Absolute: 1 10*3/uL (ref 0.1–1.0)
Monocytes Relative: 10 % (ref 3–12)
Neutro Abs: 6.4 10*3/uL (ref 1.7–7.7)
Neutrophils Relative %: 68 % (ref 43–77)

## 2010-12-29 LAB — CBC
Hemoglobin: 13 g/dL (ref 13.0–17.0)
MCH: 32.4 pg (ref 26.0–34.0)
MCHC: 35.1 g/dL (ref 30.0–36.0)
Platelets: 175 10*3/uL (ref 150–400)
RDW: 12.4 % (ref 11.5–15.5)

## 2010-12-30 DIAGNOSIS — R55 Syncope and collapse: Secondary | ICD-10-CM

## 2010-12-30 LAB — COMPREHENSIVE METABOLIC PANEL
ALT: 21 U/L (ref 0–53)
CO2: 27 mEq/L (ref 19–32)
Calcium: 8.8 mg/dL (ref 8.4–10.5)
Creatinine, Ser: 0.71 mg/dL (ref 0.50–1.35)
GFR calc Af Amer: >60 mL/min (ref >60)
GFR calc non Af Amer: >60 mL/min (ref >60)
Glucose, Bld: 101 mg/dL — ABNORMAL HIGH (ref 70–99)
Sodium: 134 mEq/L — ABNORMAL LOW (ref 135–145)
Total Protein: 6.3 g/dL (ref 6.0–8.3)

## 2010-12-30 LAB — MRSA PCR SCREENING: MRSA by PCR: NEGATIVE

## 2010-12-30 LAB — CARDIAC PANEL(CRET KIN+CKTOT+MB+TROPI)
CK, MB: 2.6 ng/mL (ref 0.3–4.0)
Relative Index: INVALID (ref 0.0–2.5)
Relative Index: INVALID (ref 0.0–2.5)
Troponin I: <0.3 ng/mL (ref <0.30)
Troponin I: <0.3 ng/mL (ref <0.30)

## 2010-12-30 LAB — CBC
HCT: 39.5 % (ref 39.0–52.0)
MCV: 92.5 fL (ref 78.0–100.0)
RBC: 4.27 MIL/uL (ref 4.22–5.81)
RDW: 12.5 % (ref 11.5–15.5)
WBC: 6.7 10*3/uL (ref 4.0–10.5)

## 2010-12-30 LAB — LIPID PANEL
HDL: 40 mg/dL (ref >39)
LDL Cholesterol: 90 mg/dL (ref 0–99)
Total CHOL/HDL Ratio: 3.9 RATIO
Triglycerides: 126 mg/dL (ref <150)

## 2010-12-30 LAB — GLUCOSE, CAPILLARY: Glucose-Capillary: 100 mg/dL — ABNORMAL HIGH (ref 70–99)

## 2010-12-31 DIAGNOSIS — I2589 Other forms of chronic ischemic heart disease: Secondary | ICD-10-CM

## 2010-12-31 LAB — BASIC METABOLIC PANEL
BUN: 10 mg/dL (ref 6–23)
Chloride: 100 mEq/L (ref 96–112)
GFR calc Af Amer: >60 mL/min (ref >60)
GFR calc non Af Amer: >60 mL/min (ref >60)
Potassium: 3.8 mEq/L (ref 3.5–5.1)
Sodium: 133 mEq/L — ABNORMAL LOW (ref 135–145)

## 2010-12-31 LAB — CBC
MCHC: 34 g/dL (ref 30.0–36.0)
Platelets: 167 10*3/uL (ref 150–400)
RDW: 12.6 % (ref 11.5–15.5)
WBC: 6.9 10*3/uL (ref 4.0–10.5)

## 2011-01-01 ENCOUNTER — Inpatient Hospital Stay (HOSPITAL_COMMUNITY): Payer: Self-pay

## 2011-01-01 DIAGNOSIS — R55 Syncope and collapse: Secondary | ICD-10-CM

## 2011-01-01 LAB — BASIC METABOLIC PANEL
Chloride: 98 mEq/L (ref 96–112)
Creatinine, Ser: 0.71 mg/dL (ref 0.50–1.35)
GFR calc Af Amer: >60 mL/min (ref >60)
Potassium: 4 mEq/L (ref 3.5–5.1)
Sodium: 133 mEq/L — ABNORMAL LOW (ref 135–145)

## 2011-01-01 MED ORDER — TECHNETIUM TC 99M TETROFOSMIN IV KIT
10.0000 | PACK | Freq: Once | INTRAVENOUS | Status: AC | PRN
  Administered 2011-01-01: 10 via INTRAVENOUS

## 2011-01-01 MED ORDER — TECHNETIUM TC 99M TETROFOSMIN IV KIT
30.0000 | PACK | Freq: Once | INTRAVENOUS | Status: AC | PRN
  Administered 2011-01-01: 30 via INTRAVENOUS

## 2011-01-02 ENCOUNTER — Other Ambulatory Visit (HOSPITAL_COMMUNITY): Payer: BC Managed Care – PPO

## 2011-01-03 ENCOUNTER — Encounter: Payer: Self-pay | Admitting: Family Medicine

## 2011-01-10 NOTE — Consult Note (Signed)
NAMEAAYAN, Lewis NO.:  [id number]  MEDICAL RECORD NO.:  [id number]  LOCATION:  2030                         FACILITY:  [facility]  PHYSICIAN:  Hillis Range, MD       DATE OF BIRTH:  1956/06/03  DATE OF CONSULTATION: DATE OF DISCHARGE:                                CONSULTATION   REQUESTING PHYSICIAN:  [facility].  REASON FOR CONSULTATION:  Syncope.  HISTORY OF PRESENT ILLNESS:  [name] is a pleasant 55 year old gentleman with a known history of coronary artery disease, ischemic cardiomyopathy (ejection fraction 45%), and New York Heart Association class I congestive heart failure, who presents following a syncopal episode.  The patient reports being in his usual state of health yesterday until approximately 6:30 p.m.  He states that he was cutting vegetables when he became very weak and fatigued.  He reports associated diaphoresis and nausea.  He went to his bedroom and lied down for an hour.  He denies associated chest pain, shortness of breath, or palpitations.  Upon waking, he went and ate dinner.  He reports that he continued to feel weak.  His spouse notes that he was "slurring his words."  The patient stood up to walk into his bedroom again and then had sudden loss of consciousness with collapse.  Upon collapsing to the floor, he quickly regained consciousness.  He was brought to [facility] for further evaluation.  He denies any associated chest pain, shortness of breath, arm/jaw pain, or other concerns.  The patient reports being out in the heat over the past few days.  He states that this past Saturday, he had been outside all day swimming in a pool and then went to a race.  He reports that at that time, he also felt very weak and fatigued.  Upon evaluation at [facility], he has been found to have significant hyponatremia and dehydration.  He has been admitted to the Hospitalist Service for further evaluation.  PAST MEDICAL  HISTORY: 1. Coronary artery disease status post cardiac arrest in 2004, due to     an acute MI with stenting of the LAD at that time.  He had     subsequent stenting of the circumflex and right coronary vessels.     Most recently, he underwent stenting of the ostial left anterior     descending vessel by Dr. Riley Kill on December 05, 2008, a drug-eluting     stent at that time. 2. Ischemic cardiomyopathy (ejection fraction 45%). 3. New York Heart Association class I congestive heart failure. 4. Hypertension. 5. Benign prostatic hypertrophy. 6. Hyperlipidemia. 7. Status post right knee surgery. 8. Status post cataract surgery. 9. Tobacco use.  Medications are reviewed in the [facility].  ALLERGIES:  PERCOCET, PERCODAN, and AMOXICILLIN.  SOCIAL HISTORY:  The patient lives in [place] with his spouse.  He continues to smoke one pack per day.  He occasionally drinks alcohol.  FAMILY HISTORY:  Notable for coronary artery disease.  REVIEW OF SYSTEMS:  All systems are reviewed and negative except as outlined in the HPI above.  PHYSICAL EXAMINATION:  Telemetry reveals sinus rhythm. VITAL SIGNS:  Blood pressure 127/64, heart rate 68,  respirations 18, sats 100% on room air, afebrile. GENERAL:  The patient is a well-appearing male in no acute distress.  He is alert and oriented x3. HEENT:  Normocephalic, atraumatic.  Sclerae clear.  Conjunctivae pink. Oropharynx clear. NECK:  Supple.  JVP flat. LUNGS:  Clear. HEART:  Regular rate and rhythm.  No murmurs, rubs, or gallops. GI:  Soft, nontender, nondistended.  Positive bowel sounds. EXTREMITIES:  No clubbing, cyanosis, or edema. SKIN:  No ecchymoses or lacerations. MUSCULOSKELETAL:  No deformity or atrophy. PSYCHIATRIC:  Euthymic mood.  Full affect.  EKG reveals sinus rhythm with an anterior infarction pattern and chronic ST changes which do not appear to be acute.  Labs are reviewed in the [facility].  Cardiac markers are negative.  TSH is 0.78.   Potassium 4.3, creatinine 0.7.  Upon arrival, his sodium was 126, presently his sodium is 134.  Echocardiogram from December 30, 2010, is reviewed and reveals an ejection fraction of 45%, akinesis of all the apical segments otherwise appear to move.  IMPRESSION:  [name] is a 55 year old gentleman with a known history of coronary artery disease and an ischemic cardiomyopathy (ejection fraction 45%) with New York Heart Association class I heart failure symptoms chronically.  He now presents following an episode of syncope which I believe is most likely due to dehydration and also hyponatremia. These are likely exacerbated by excessive fatigue as well as hydrochlorothiazide.  I think that his postural syncope may have also been contributed to by terazosin.  I would, therefore, recommend that we stop hydrochlorothiazide and possibly terazosin at this time.  We will gently hydrate the patient and follow his orthostatic blood pressures. I think that once his acute dehydration and hyponatremia have been treated that he should have a GXT Myoview performed in our office.  We will continue to adjust his medications for his chronic cardiomyopathy during his hospital stay.     Hillis Range, MD     JA/MEDQ  D:  12/30/2010  T:  12/31/2010  Job:  [id number]  cc:   [facility] Madolyn Frieze. Jens Som, MD, [facility]  Electronically Signed by Hillis Range MD on 01/10/2011 10:00:35 AM

## 2011-01-29 NOTE — Discharge Summary (Signed)
Jimmy Lewis, ATKISON NO.:  [id number]  MEDICAL RECORD NO.:  [id number]  LOCATION:  2030                         FACILITY:  [facility]  PHYSICIAN:  Peggye Pitt, M.D. DATE OF BIRTH:  1956-01-06  DATE OF ADMISSION:  12/29/2010 DATE OF DISCHARGE:  01/01/2011                              DISCHARGE SUMMARY   PRIMARY CARE PHYSICIAN:  He is currently unassigned, however, we have set him up with a new patient appointment at [facility] to see Dr. Suzie Portela.  DISCHARGE DIAGNOSES: 1. Syncope, presumed secondary to orthostatic hypotension and     hyponatremia. 2. Orthostatic hypotension. 3. Hyponatremia. 4. Chronic systolic congestive heart failure with ejection fraction of     45%. 5. Hypertension. 6. Hyperlipidemia. 7. Continued tobacco abuse. 8. Ischemic cardiomyopathy with ejection fraction of 45% and history     of myocardial infarction status post ventricular fibrillation     arrest in 2005.  DISCHARGE MEDICATIONS: 1. Coreg 6.25 mg twice daily. 2. Aspirin 325 mg daily. 3. Fluoxetine 40 mg daily. 4. Lipitor 20 mg at bedtime. 5. Multivitamin 1 tablet daily. 6. Vitamin D2 one tablet daily.  He has been instructed to discontinue     use of his hydrochlorothiazide and his terazosin.  CONSULTATION THIS HOSPITALIZATION: 1. Cassell Clement, M.D. with Cardiology. 2. Hillis Range, MD with Cardiology.  DISPOSITION AND FOLLOWUP:  Mr. Brach will be discharged home today in stable and improved condition.  He will need to follow up with his new PCP as scheduled on January 24, 2011.  Images and procedures performed during this hospitalization include: 1. A chest x-ray on December 30, 2010, that showed no acute findings. 2. A Myoview stress test on January 01, 2011, that showed no evidence for     a pharmacologically-induced ischemia.  A large infarct/scar     involving the apical segments of the anterior wall and entire left     ventricular apex.  Moderate-to-marked  decreased left ventricular     systolic function with akinesis involving the left ventricular apex     and hypokinesis.  HISTORY AND PHYSICAL:  For complete details, please refer to dictation on December 29, 2010, by Dr. Mikeal Hawthorne, but in brief, [name] is a pleasant 55 year old Caucasian gentleman with a history of above, who presented to the hospital after a syncopal event.  He has passed out at least 4 times in the last month.  Because of this, he was admitted to our service where he was found to have a very low sodium of 126.  HOSPITAL COURSE BY PROBLEM: 1. Syncope.  This was thought to be secondary to orthostasis.  His     hydrochlorothiazide has been discontinued and with this, his sodium     levels have increased.  We have also discontinued his terazosin.     He has had no further syncopal events while in the hospital. 2. Hyponatremia.  Sodium level on day of discharge is 133, up from 126     on discharge.  We have accomplished this with discontinuation of     his hydrochlorothiazide plus gentle IV fluid repletion. 3. For his chronic systolic CHF, Cardiology did decide to perform a  stress test which only showed his old scar and no acute ischemia.     Cardiology believes he is stable for discharge home today to follow     up with his regular cardiologist.  Rest of chronic medical conditions have been stable.  VITALS ON DAY OF DISCHARGE:  Blood pressure 137/84, heart rate 71, respirations 18, sats 99% on room air, and temperature 98.3.     Peggye Pitt, M.D.     EH/MEDQ  D:  01/01/2011  T:  01/02/2011  Job:  [id number]  cc:   Dr. Eliot Ford, M.D. Hillis Range, MD  Electronically Signed by Peggye Pitt M.D. on 01/29/2011 02:03:51 PM

## 2011-03-25 ENCOUNTER — Telehealth: Payer: Self-pay | Admitting: Cardiology

## 2011-03-25 NOTE — Telephone Encounter (Signed)
Pt wants refill lisinipril. Carvedilol  Called to [facility] on [address] [phone]

## 2011-03-29 MED ORDER — LISINOPRIL 40 MG PO TABS: 40.0000 mg | ORAL_TABLET | Freq: Every day | ORAL | Status: DC

## 2011-03-29 MED ORDER — CARVEDILOL 25 MG PO TABS: 25.0000 mg | ORAL_TABLET | Freq: Two times a day (BID) | ORAL | Status: DC

## 2011-04-02 NOTE — H&P (Signed)
Jimmy Lewis, Jimmy Lewis              ACCOUNT NO.:  [id number]  MEDICAL RECORD NO.:  [id number]  LOCATION:  2030                         FACILITY:  [facility]  PHYSICIAN:  Lonia Blood, M.D.      DATE OF BIRTH:  [birth year]  DATE OF ADMISSION:  12/29/2010 DATE OF DISCHARGE:                             HISTORY & PHYSICAL   PRIMARY CARE PHYSICIAN:  Unassigned.  His cardiologist is [facility].  PRESENTING COMPLAINT:  Passing out.  HISTORY OF PRESENT ILLNESS:  The patient is a 55 year old gentleman brought from home by his wife secondary to recurrent passing out at least 4 times in the last month.  He has extensive cardiac history with coronary artery disease multiple cath and apparently stenting.  He has also had frequent follow ups with the Cardiology for the same reasons. His last cardiac cath was in January 2010.  He denied any chest pain or shortness of breath but apparently he just passed out.  In the ED, the patient was found to be weak, dehydrated but no EKG changes.  PAST MEDICAL HISTORY:  Significant for ischemic cardiomyopathy with an EF of 35%, history of anterior wall MI with sudden death in 11/18/2002.  He was status post percutaneous transluminal coronary angioplasty and stent to the right coronary artery and circumflex.  Chronic systolic heart failure, hypertension, hyperlipidemia, chronic fatigue, status post right knee surgery, and cataract surgery.  Continued tobacco abuse.  ALLERGIES:  To PERCOCET, PERCODAN, and AMOXICILLIN.  MEDICATIONS:  Aspirin 325 mg daily, atorvastatin 20 mg  nightly, fluoxetine 40 mg daily, hydrochlorothiazide 12.5 mg daily, and terazosin 5 mg daily.  SOCIAL HISTORY:  The patient lives in [place] with his wife.  He still smokes about one pack per day.  Occasional alcohol, denied any IV drug use.  FAMILY HISTORY:  Significant for coronary artery disease in both parents.  REVIEW OF SYSTEMS:  All systems reviewed are negative except  per HPI.  PHYSICAL EXAMINATION:  VITAL SIGNS:  Temperature is 97.0, blood pressure 107/62 with pulse 68, respiratory rate 18, sats 99% on room air. GENERAL:  He is awake, alert, oriented.  He is in no acute distress. HEENT:  PERRL.  EOMI.  No significant pallor, no jaundice.  No rhinorrhea. NECK:  Supple.  No JVD, no lymphadenopathy. RESPIRATORY:  He has good air entry bilaterally.  No wheeze, no rales. No crackles. CARDIOVASCULAR:  He has S1 and S2.  No murmur. ABDOMEN:  Soft, full, nontender with positive bowel sound. EXTREMITIES:  No edema, cyanosis, or clubbing. SKIN:  No rashes.  No ulcers.  LABORATORY DATA:  White count is 9.5, hemoglobin 13.0, platelet count 175 with normal differentials.  Sodium is 126, potassium 4.3, chloride 91, CO2 of 25, glucose 163, BUN 11, creatinine 0.77, calcium 9.1.  Chest x-ray showed no acute abnormalities.  His EKG showed normal sinus rhythm.  No significant ST-T wave changes.  ASSESSMENT:  This is a 55 year old gentleman presenting with generalized weakness, syncope and profoundly hyponatremic and seems dehydrated.  Per wife, he takes hydrochlorothiazide every other day, but apparently a month ago he had blood work and was told that his sodium was low even at that point.  Chances are therefore the patient is hyponatremic from a combination of low salt intake and diuretics.  But we may not completely rule out syndrome of inappropriate antidiuretic hormone in a chronic smoker, although his chest x-ray showed no lung findings.  Other possibilities include salt losing enteropathy and some renal disease.  PLAN: 1. Syncope more than likely from dehydration, although the cardiac     history cannot be completed discounted, so I will put him on tele.     We will see if has no recent echocardiogram we will repeat 2-D echo     possibly carotid Dopplers.  I will attempt to correct the sodium     and watch him on tele with addition of PtO2.  Cardiology is  also     consulted for further advise whether or not we should do an MRI or     other full syncopal workup. 2. Hyponatremia.  I will give him saline gently, hold his     hydrochlorothiazide.  We will also check urine and serum osmolarity     and urine sodium.  The patient may need to take no added salt     rather than no salt at all. 3. Coronary artery disease.  He seems stable.  I will cycle his     enzymes, however, while keep him on tele and cardiology followup. 4. Depression, anxiety.  Continue with fluoxetine.  Tobacco abuse.     The patient has been counseled.  I have offered him nicotine patch,     although he is declining.     Lonia Blood, M.D.     Jimmy Lewis  D:  12/30/2010  T:  12/30/2010  Job:  [id number]  Electronically Signed by Lonia Blood M.D. on 04/02/2011 02:40:26 PM

## 2012-04-16 ENCOUNTER — Encounter: Payer: Self-pay | Admitting: Cardiology

## 2013-05-27 ENCOUNTER — Emergency Department (HOSPITAL_COMMUNITY)
Admission: EM | Admit: 2013-05-27 | Discharge: 2013-05-27 | Disposition: A | Discharge status: Home / Self Care | Payer: Self-pay | Attending: Emergency Medicine | Admitting: Emergency Medicine

## 2013-05-27 ENCOUNTER — Encounter (HOSPITAL_COMMUNITY): Payer: Self-pay | Admitting: Emergency Medicine

## 2013-05-27 ENCOUNTER — Other Ambulatory Visit: Payer: Self-pay

## 2013-05-27 DIAGNOSIS — I2510 Atherosclerotic heart disease of native coronary artery without angina pectoris: Secondary | ICD-10-CM | POA: Insufficient documentation

## 2013-05-27 DIAGNOSIS — Y9301 Activity, walking, marching and hiking: Secondary | ICD-10-CM | POA: Insufficient documentation

## 2013-05-27 DIAGNOSIS — R42 Dizziness and giddiness: Secondary | ICD-10-CM | POA: Insufficient documentation

## 2013-05-27 DIAGNOSIS — Z791 Long term (current) use of non-steroidal anti-inflammatories (NSAID): Secondary | ICD-10-CM | POA: Insufficient documentation

## 2013-05-27 DIAGNOSIS — I10 Essential (primary) hypertension: Secondary | ICD-10-CM | POA: Insufficient documentation

## 2013-05-27 DIAGNOSIS — T448X5A Adverse effect of centrally-acting and adrenergic-neuron-blocking agents, initial encounter: Secondary | ICD-10-CM | POA: Insufficient documentation

## 2013-05-27 DIAGNOSIS — S0191XA Laceration without foreign body of unspecified part of head, initial encounter: Secondary | ICD-10-CM

## 2013-05-27 DIAGNOSIS — R404 Transient alteration of awareness: Secondary | ICD-10-CM | POA: Insufficient documentation

## 2013-05-27 DIAGNOSIS — Z862 Personal history of diseases of the blood and blood-forming organs and certain disorders involving the immune mechanism: Secondary | ICD-10-CM | POA: Insufficient documentation

## 2013-05-27 DIAGNOSIS — Z9861 Coronary angioplasty status: Secondary | ICD-10-CM | POA: Insufficient documentation

## 2013-05-27 DIAGNOSIS — Z8639 Personal history of other endocrine, nutritional and metabolic disease: Secondary | ICD-10-CM | POA: Insufficient documentation

## 2013-05-27 DIAGNOSIS — Z79899 Other long term (current) drug therapy: Secondary | ICD-10-CM | POA: Insufficient documentation

## 2013-05-27 DIAGNOSIS — Z7982 Long term (current) use of aspirin: Secondary | ICD-10-CM | POA: Insufficient documentation

## 2013-05-27 DIAGNOSIS — I252 Old myocardial infarction: Secondary | ICD-10-CM | POA: Insufficient documentation

## 2013-05-27 DIAGNOSIS — N400 Enlarged prostate without lower urinary tract symptoms: Secondary | ICD-10-CM | POA: Insufficient documentation

## 2013-05-27 DIAGNOSIS — W1809XA Striking against other object with subsequent fall, initial encounter: Secondary | ICD-10-CM | POA: Insufficient documentation

## 2013-05-27 DIAGNOSIS — Y92009 Unspecified place in unspecified non-institutional (private) residence as the place of occurrence of the external cause: Secondary | ICD-10-CM | POA: Insufficient documentation

## 2013-05-27 DIAGNOSIS — S0180XA Unspecified open wound of other part of head, initial encounter: Secondary | ICD-10-CM | POA: Insufficient documentation

## 2013-05-27 DIAGNOSIS — T50905A Adverse effect of unspecified drugs, medicaments and biological substances, initial encounter: Secondary | ICD-10-CM

## 2013-05-27 DIAGNOSIS — W19XXXA Unspecified fall, initial encounter: Secondary | ICD-10-CM

## 2013-05-27 DIAGNOSIS — F17200 Nicotine dependence, unspecified, uncomplicated: Secondary | ICD-10-CM | POA: Insufficient documentation

## 2013-05-27 LAB — POCT I-STAT, CHEM 8
Chloride: 96 mEq/L (ref 96–112)
Creatinine, Ser: 1.1 mg/dL (ref 0.50–1.35)
Glucose, Bld: 123 mg/dL — ABNORMAL HIGH (ref 70–99)
Hemoglobin: 14.3 g/dL (ref 13.0–17.0)
Potassium: 4.5 mEq/L (ref 3.5–5.1)
TCO2: 26 mmol/L (ref 0–100)

## 2013-05-27 MED ORDER — HYDROCODONE-ACETAMINOPHEN 5-325 MG PO TABS
2.0000 | ORAL_TABLET | Freq: Once | ORAL | Status: AC
  Administered 2013-05-27: 2 via ORAL
  Filled 2013-05-27: qty 2

## 2013-05-27 MED ORDER — ONDANSETRON 4 MG PO TBDP
8.0000 mg | ORAL_TABLET | Freq: Once | ORAL | Status: AC
  Administered 2013-05-27: 8 mg via ORAL
  Filled 2013-05-27: qty 2

## 2013-05-27 MED ORDER — ONDANSETRON HCL 4 MG/2ML IJ SOLN
INTRAMUSCULAR | Status: AC
  Filled 2013-05-27: qty 2

## 2013-05-27 MED ORDER — OXYCODONE-ACETAMINOPHEN 5-325 MG PO TABS: 2.0000 | ORAL_TABLET | Freq: Once | ORAL | Status: DC

## 2013-05-27 MED ORDER — ONDANSETRON HCL 4 MG/2ML IJ SOLN: 4.0000 mg | Freq: Once | INTRAMUSCULAR | Status: DC

## 2013-05-27 NOTE — ED Provider Notes (Addendum)
I saw and evaluated the patient, reviewed the resident's note and I agree with the findings and plan.  EKG Interpretation   None        Date: 05/27/2013  Rate: 80  Rhythm: normal sinus rhythm  QRS Axis: normal  Intervals: normal  ST/T Wave abnormalities: nonspecific T wave changes  Conduction Disutrbances:none  Narrative Interpretation:   Old EKG Reviewed: none available  Jimmy Lewis is a 57 y.o. male hx of HL, HTN, CAD here with fall. Took extra tamsulosin today. Felt lightheaded and passed out. Hit metal rack and had forehead laceration. Denies chest pain or shortness of breath. Not on blood thinners. Tetanus up to date. Exam revealed 4 inch laceration on the forehead, not involving galea. The resident was able to place 4 sutures and and 4 staples to close the wound. Bleeding controlled. No need to do further syncope workup.    Richardean Canal, MD 05/27/13 1610  Richardean Canal, MD 06/04/13 [phone]

## 2013-05-27 NOTE — ED Notes (Signed)
Pt was at home had a syncopal episode. Pt has 4-5inch laceration to forehead. Pt states he took 2 tabs of his prostate medication, he was only supposed to take one, an hour later he felt lightheaded. Pt attempted to walk to his room and passed out and hit his head.

## 2013-05-27 NOTE — ED Notes (Signed)
Patient with lessening pain,+3 does not feel nausea

## 2013-05-27 NOTE — ED Provider Notes (Signed)
CSN: [id number]     Arrival date & time 05/27/13  1848 History   First MD Initiated Contact with Patient 05/27/13 1902     Chief Complaint  Patient presents with  . Loss of Consciousness  . Head Laceration   (Consider location/radiation/quality/duration/timing/severity/associated sxs/prior Treatment) HPI Comments: 57 year old male past medical history significant for hyperlipidemia, hypertension, CAD, BPH comes in chief complaint of fall.  Patient states he began taking tamsulosin today for BPH. Reports that taking one pill as he was instructed he took 2. Approximately one hour after taking this began to feel lightheaded, decided to take nap and upon walking to his room states she got lightheaded and fell striking head on for furniture. Patient states he did not syncopized he remembers the entire event. No loss of consciousness or striking head. Not on blood thinners. Denies chest pain, shortness of breath or weakness prior to or after the event  Patient is a 57 y.o. male presenting with scalp laceration and fall.  Head Laceration Associated symptoms include headaches. Pertinent negatives include no abdominal pain, chest pain, chills, coughing, diaphoresis, fatigue, fever, nausea, vertigo, visual change or weakness.  Fall This is a new problem. The current episode started today. The problem occurs rarely. The problem has been resolved. Associated symptoms include headaches. Pertinent negatives include no abdominal pain, chest pain, chills, coughing, diaphoresis, fatigue, fever, nausea, vertigo, visual change or weakness. The symptoms are aggravated by standing. He has tried nothing for the symptoms. The treatment provided no relief.    Past Medical History  Diagnosis Date  . Hyperlipidemia   . Hypertension   . Insomnia   . Myocardial infarction   . Tobacco abuse   . Dyslipidemia    Past Surgical History  Procedure Laterality Date  . Cardiac surgery      Stents 2005/2007   History  reviewed. No pertinent family history. History  Substance Use Topics  . Smoking status: Current Every Day Smoker -- 1.00 packs/day    Types: Cigarettes  . Smokeless tobacco: Not on file  . Alcohol Use: 0.0 oz/week    2-5 Cans of beer per week    Review of Systems  Constitutional: Negative for fever, chills, diaphoresis and fatigue.  Eyes: Negative for visual disturbance.  Respiratory: Negative for cough.   Cardiovascular: Negative for chest pain and palpitations.  Gastrointestinal: Negative for nausea and abdominal pain.  Endocrine: Negative for polyuria.  Genitourinary: Negative for dysuria.  Neurological: Positive for light-headedness and headaches. Negative for vertigo and weakness.  All other systems reviewed and are negative.    Allergies  Oxycodone-aspirin  Home Medications   Current Outpatient Rx  Name  Route  Sig  Dispense  Refill  . aspirin 325 MG EC tablet   Oral   Take 325 mg by mouth 2 (two) times daily.          Marland Kitchen FLUoxetine (PROZAC) 40 MG capsule   Oral   Take 40 mg by mouth daily as needed (for anxiety).         Marland Kitchen ibuprofen (ADVIL,MOTRIN) 200 MG tablet   Oral   Take 400 mg by mouth 2 (two) times daily.         . Multiple Vitamins-Minerals (MULTIVITAMIN WITH MINERALS) tablet   Oral   Take 1 tablet by mouth daily.           Marland Kitchen terazosin (HYTRIN) 5 MG capsule   Oral   Take 5 mg by mouth daily.         Marland Kitchen  traZODone (DESYREL) 100 MG tablet   Oral   Take 100 mg by mouth every other day.          BP 125/75  Pulse 74  Temp(Src) 98 F (36.7 C) (Oral)  Resp 17  SpO2 98% Physical Exam  Nursing note and vitals reviewed. Constitutional: He is oriented to person, place, and time. He appears well-developed and well-nourished.  HENT:  Head: Normocephalic.  4 and laceration on patient's for extending midline posteriorly into the hairline  Eyes: EOM are normal. Pupils are equal, round, and reactive to light.  Neck: Normal range of motion.   Cardiovascular: Normal rate, regular rhythm and intact distal pulses.   Pulmonary/Chest: Effort normal and breath sounds normal. No respiratory distress. He exhibits no tenderness.  Abdominal: Soft. He exhibits no distension. There is no tenderness. There is no rebound and no guarding.  Musculoskeletal: Normal range of motion.  Neurological: He is alert and oriented to person, place, and time. No cranial nerve deficit. He exhibits normal muscle tone. Coordination normal.  Skin: Skin is warm and dry. No rash noted.  Psychiatric: He has a normal mood and affect. His behavior is normal. Judgment and thought content normal.    ED Course  LACERATION REPAIR Date/Time: 05/27/2013 10:21 PM Performed by: Jisselle Poth Authorized by: Philip Kotlyar Consent: Verbal consent obtained. Consent given by: patient Body area: head/neck (scalp and forehead) Laceration length: 8 cm Foreign bodies: no foreign bodies Tendon involvement: none Nerve involvement: none Vascular damage: no Anesthesia: local infiltration Local anesthetic: lidocaine 1% with epinephrine Anesthetic total: 8 ml Irrigation solution: saline Irrigation method: jet lavage Amount of cleaning: standard Debridement: none Degree of undermining: minimal Skin closure: 4-0 nylon and staples Number of sutures: 4 stalpes and 4 sutures. Technique: simple Approximation: close Approximation difficulty: simple Patient tolerance: Patient tolerated the procedure well with no immediate complications.   (including critical care time) Labs Review Labs Reviewed  POCT I-STAT, CHEM 8 - Abnormal; Notable for the following:    Sodium 130 (*)    Glucose, Bld 123 (*)    All other components within normal limits   Imaging Review No results found.  EKG Interpretation   None       MDM   1. Laceration of head, initial encounter   2. Fall, initial encounter   3. Medication adverse effect, initial encounter     57 year old male status Moani Weipert  fall. Patient reports having presyncope prior fall however no true syncopal episode. Likely precipitated by patient taking tamsulosin. Patient started taking today. Took double the recommended dose. Afterward a lightheaded presyncopal episode and fall. No true LOC. Normal neurological exam without focal deficit. Patient vitals limits. Able ambulate without issue. No dizziness lightheadedness et Karie Soda. Patient on blood thinners. Acting appropriately at baseline. No need for neuro imaging at this time. Wound irrigated and closed per procedure note above. Up-to-date on tetanus. No other concerning features on exam. Patient able ambulate tolerate by mouth. Remained no apparent distress nontoxic appearing, deemed appropriate for discharge. Given return precautions for worsening headache, nausea, vomiting, ataxia or other concerning signs or symptoms. Return for suture removal 10-14 days. Also instructed to discontinue Flomax until followup with urologist. Voiced understanding. No further acute issues to discharge    Bridgett Larsson, MD 05/27/13 2245

## 2013-05-27 NOTE — ED Notes (Signed)
MD at bedside.patient being suture by er attending

## 2013-06-20 HISTORY — PX: BACK SURGERY: SHX140

## 2014-11-24 ENCOUNTER — Encounter (HOSPITAL_COMMUNITY): Payer: Self-pay

## 2014-11-24 ENCOUNTER — Emergency Department (HOSPITAL_COMMUNITY)
Admission: EM | Admit: 2014-11-24 | Discharge: 2014-11-24 | Disposition: A | Discharge status: Home / Self Care | Payer: Commercial Managed Care - HMO | Attending: Emergency Medicine | Admitting: Emergency Medicine

## 2014-11-24 ENCOUNTER — Emergency Department (HOSPITAL_COMMUNITY): Payer: Commercial Managed Care - HMO

## 2014-11-24 DIAGNOSIS — T148XXA Other injury of unspecified body region, initial encounter: Secondary | ICD-10-CM

## 2014-11-24 DIAGNOSIS — Z79899 Other long term (current) drug therapy: Secondary | ICD-10-CM | POA: Insufficient documentation

## 2014-11-24 DIAGNOSIS — T07XXXA Unspecified multiple injuries, initial encounter: Secondary | ICD-10-CM

## 2014-11-24 DIAGNOSIS — S30810A Abrasion of lower back and pelvis, initial encounter: Secondary | ICD-10-CM | POA: Insufficient documentation

## 2014-11-24 DIAGNOSIS — G4700 Insomnia, unspecified: Secondary | ICD-10-CM | POA: Insufficient documentation

## 2014-11-24 DIAGNOSIS — S52501A Unspecified fracture of the lower end of right radius, initial encounter for closed fracture: Secondary | ICD-10-CM | POA: Diagnosis not present

## 2014-11-24 DIAGNOSIS — Y9389 Activity, other specified: Secondary | ICD-10-CM | POA: Diagnosis not present

## 2014-11-24 DIAGNOSIS — W28XXXA Contact with powered lawn mower, initial encounter: Secondary | ICD-10-CM | POA: Diagnosis not present

## 2014-11-24 DIAGNOSIS — Z8639 Personal history of other endocrine, nutritional and metabolic disease: Secondary | ICD-10-CM | POA: Insufficient documentation

## 2014-11-24 DIAGNOSIS — T148 Other injury of unspecified body region: Secondary | ICD-10-CM | POA: Insufficient documentation

## 2014-11-24 DIAGNOSIS — Y998 Other external cause status: Secondary | ICD-10-CM | POA: Insufficient documentation

## 2014-11-24 DIAGNOSIS — Z791 Long term (current) use of non-steroidal anti-inflammatories (NSAID): Secondary | ICD-10-CM | POA: Diagnosis not present

## 2014-11-24 DIAGNOSIS — I10 Essential (primary) hypertension: Secondary | ICD-10-CM | POA: Diagnosis not present

## 2014-11-24 DIAGNOSIS — Z7982 Long term (current) use of aspirin: Secondary | ICD-10-CM | POA: Insufficient documentation

## 2014-11-24 DIAGNOSIS — W102XXA Fall (on)(from) incline, initial encounter: Secondary | ICD-10-CM

## 2014-11-24 DIAGNOSIS — S6991XA Unspecified injury of right wrist, hand and finger(s), initial encounter: Secondary | ICD-10-CM | POA: Diagnosis present

## 2014-11-24 DIAGNOSIS — Y92828 Other wilderness area as the place of occurrence of the external cause: Secondary | ICD-10-CM | POA: Diagnosis not present

## 2014-11-24 DIAGNOSIS — I252 Old myocardial infarction: Secondary | ICD-10-CM | POA: Diagnosis not present

## 2014-11-24 DIAGNOSIS — Z720 Tobacco use: Secondary | ICD-10-CM | POA: Diagnosis not present

## 2014-11-24 DIAGNOSIS — S81811A Laceration without foreign body, right lower leg, initial encounter: Secondary | ICD-10-CM | POA: Insufficient documentation

## 2014-11-24 MED ORDER — HYDROCODONE-ACETAMINOPHEN 5-325 MG PO TABS
2.0000 | ORAL_TABLET | Freq: Once | ORAL | Status: AC
  Administered 2014-11-24: 2 via ORAL
  Filled 2014-11-24: qty 2

## 2014-11-24 MED ORDER — BACITRACIN 500 UNIT/GM EX OINT
1.0000 "application " | TOPICAL_OINTMENT | Freq: Once | CUTANEOUS | Status: AC
  Administered 2014-11-24: 1 via TOPICAL
  Filled 2014-11-24 (×2): qty 0.9

## 2014-11-24 MED ORDER — HYDROCODONE-ACETAMINOPHEN 7.5-325 MG PO TABS: 1.0000 | ORAL_TABLET | Freq: Four times a day (QID) | ORAL | Status: AC | PRN

## 2014-11-24 NOTE — ED Notes (Signed)
Pt was mowing the yard and rolled the riding mower and him down a hill and presents with a lac to his right knee and deformity to his right wrist.

## 2014-11-24 NOTE — Discharge Instructions (Signed)
Continue to take ibuprofen as needed. Do not take the narcotic if driving as it will make you sleepy.  Follow up with Dr Mina MarbleWeingold.

## 2014-11-24 NOTE — ED Provider Notes (Signed)
CSN: [id number]     Arrival date & time 11/24/14  1503 History  This chart was scribed for Kerrie Buffalo, NP working with Vanetta Mulders, MD by Evon Slack, ED Scribe. This patient was seen in room TR01C/TR01C and the patient's care was started at 3:38 PM.     Chief Complaint  Patient presents with  . Extremity Laceration  . Wrist Pain   Patient is a 59 y.o. male presenting with arm injury and skin laceration. The history is provided by the patient. No language interpreter was used.  Arm Injury Location:  Wrist Injury: yes   Mechanism of injury comment:  Lawn mower accident  Wrist location:  R wrist Pain details:    Radiates to:  Does not radiate   Severity:  Moderate   Onset quality:  Sudden   Timing:  Constant   Progression:  Unchanged Chronicity:  New Foreign body present:  No foreign bodies Tetanus status:  Up to date Relieved by:  None tried Worsened by:  Movement Ineffective treatments:  None tried Associated symptoms: swelling   Associated symptoms: no numbness and no tingling   Laceration Location:  Leg Leg laceration location:  R knee Length (cm):  2.5 Depth:  Cutaneous Bleeding: controlled   Pain details:    Severity:  Moderate Tetanus status:  Up to date  HPI Comments: Jimmy Lewis is a 59 y.o. male who presents to the Emergency Department complaining of laceration to his right knee onset today PTA. Pt states that he was riding his lawn mower on a hill and the lawn mower flipped over. Pt is complaining of right wrist pain with associated swelling. Pt rates the severity of his wrist pain 10/10 that's worse with movement. Pt reports Hx of right hand fracture 3 years prior. Pt states that he is on blood thinners. Pt states that his tetanus is UTD. Pt states that he is unsure if he hit his head but denies LOC and does not have headache. Pt denies nausea, vomiting, numbness or tingling.      Past Medical History  Diagnosis Date  . Hyperlipidemia   .  Hypertension   . Insomnia   . Myocardial infarction   . Tobacco abuse   . Dyslipidemia    Past Surgical History  Procedure Laterality Date  . Cardiac surgery      Stents 2005/2007   No family history on file. History  Substance Use Topics  . Smoking status: Current Every Day Smoker -- 1.00 packs/day    Types: Cigarettes  . Smokeless tobacco: Not on file  . Alcohol Use: 0.0 oz/week    2-5 Cans of beer per week    Review of Systems  Musculoskeletal: Positive for joint swelling and arthralgias.       Right wrist and hand pain  Skin: Positive for wound.       Abrasion right hand and back. Laceration right lower leg.  Neurological: Negative for syncope.  All other systems reviewed and are negative.     Allergies  Oxycodone-aspirin  Home Medications   Prior to Admission medications   Medication Sig Start Date End Date Taking? Authorizing Provider  aspirin 325 MG EC tablet Take 325 mg by mouth 2 (two) times daily.     Historical Provider, MD  FLUoxetine (PROZAC) 40 MG capsule Take 40 mg by mouth daily as needed (for anxiety).    Historical Provider, MD  HYDROcodone-acetaminophen (NORCO) 7.5-325 MG per tablet Take 1 tablet by mouth every 6 (six)  hours as needed for moderate pain. 11/24/14   Branston Halsted Orlene Och, NP  ibuprofen (ADVIL,MOTRIN) 200 MG tablet Take 400 mg by mouth 2 (two) times daily.    Historical Provider, MD  Multiple Vitamins-Minerals (MULTIVITAMIN WITH MINERALS) tablet Take 1 tablet by mouth daily.      Historical Provider, MD  terazosin (HYTRIN) 5 MG capsule Take 5 mg by mouth daily.    Historical Provider, MD  traZODone (DESYREL) 100 MG tablet Take 100 mg by mouth every other day.    Historical Provider, MD   BP 129/88 mmHg  Pulse 78  Temp(Src) 97.5 F (36.4 C) (Oral)  Resp 20  Ht  (1.854 m)  Wt 179 lb (81.194 kg)  BMI 23.62 kg/m2  SpO2 100%   Physical Exam  Constitutional: He is oriented to person, place, and time. He appears well-developed and  well-nourished. No distress.  HENT:  Head: Normocephalic and atraumatic.  Right Ear: Tympanic membrane normal. No hemotympanum.  Left Ear: Tympanic membrane normal. No hemotympanum.  Mouth/Throat: Uvula is midline, oropharynx is clear and moist and mucous membranes are normal.  Eyes: Conjunctivae and EOM are normal. Pupils are equal, round, and reactive to light.  Neck: Neck supple. No tracheal deviation present.  Cardiovascular: Normal rate, regular rhythm and normal heart sounds.   No murmur heard. Pulmonary/Chest: Effort normal and breath sounds normal. No respiratory distress. He has no wheezes. He has no rales.  Lungs clear to auscultation.   Abdominal: Soft. There is no tenderness.  Musculoskeletal: Normal range of motion. He exhibits tenderness.  Right lower leg just below patella has laceration that's 2.5 cm and superficial, DP 2+ with adequate circulation, good ROM.  Right hand swollen on dorsum, 2+ radial pulse, slight deformity noted to radial aspect of right wrist. Tender to palpation. Limited ROM due to swelling and pain. Adequate circulation, good touch sensation.   Neurological: He is alert and oriented to person, place, and time.  Skin: Skin is warm and dry.  abrasion to left lower lumbar area.   Psychiatric: He has a normal mood and affect. His behavior is normal.  Nursing note and vitals reviewed.   ED Course  Procedures (including critical care time) DIAGNOSTIC STUDIES: Oxygen Saturation is 100% on RA, normal by my interpretation.    COORDINATION OF CARE: 4:03 PM-Discussed treatment plan with pt at bedside and pt agreed to plan.     Labs Review Labs Reviewed - No data to display  Imaging Review Dg Wrist Complete Right  11/24/2014   CLINICAL DATA:  Right wrist injury and extremity laceration while mowing the lawn today. Lateral right wrist pain radiating to the elbow. Initial encounter.  EXAM: RIGHT WRIST - COMPLETE 3+ VIEW  COMPARISON:  None.  FINDINGS: There  is a mildly impacted fracture of the distal radius with mild dorsal displacement and slight dorsal angulation. The fracture extends into the distal radioulnar joint without definite involvement of the radiocarpal articular surface. The distal ulna appears intact. No dislocation is seen. There is overlying soft tissue swelling. Three fixation screws are noted in the shaft of the fifth metacarpal. Degenerative joint space narrowing and subchondral sclerosis are noted at the scaphoid- trapezium articulation.  IMPRESSION: Mildly displaced fracture of the distal radius.   Electronically Signed   By: Sebastian Ache   On: 11/24/2014 15:46   Dg Hand Complete Right  11/24/2014   CLINICAL DATA:  Right hand injury today with acute medial right hand pain. Initial encounter.  EXAM: RIGHT  HAND - COMPLETE 3+ VIEW  COMPARISON:  None  FINDINGS: There is no evidence of acute fracture, subluxation or dislocation.  Three screws within the fifth metacarpal noted.  Degenerative changes at the scaphoid -trapezium articulation and first carpometacarpal joint noted.  Calcification within the radiocarpal joint and at the scapholunate interval noted.  Two metallic foreign bodies within the soft tissues just medial and anterior to the second metatarsal head noted.  IMPRESSION: Two metallic foreign bodies within the soft tissues - just medial and anterior to the second metatarsal head, of uncertain chronicity.  No evidence of acute bony abnormality.  Degenerative changes/ chondrocalcinosis within the wrist.   Electronically Signed   By: Harmon PierJeffrey  Hu M.D.   On: 11/24/2014 15:45    X-rays reviewed with Dr. Deretha EmoryZackowski and will splint right wrist and refer patient to the hand surgeon.   MDM  59 y.o. 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 Also with laceration and abrasions. Stable for d/c without neurovascular compromise. Splint applied to right wrist, ice, sling, elevation and  Follow up with the hand  surgeon. Discussed with the patient and all questioned fully answered. He will rweturn if any problems arise.  Final diagnoses:  Distal radius fracture, right, closed, initial encounter  Abrasions of multiple sites  Contusion  Fall (on)(from) incline, initial encounter     I personally performed the services described in this documentation, which was scribed in my presence. The recorded information has been reviewed and is accurate.      [address]Hope LouisvilleM Innocence Schlotzhauer, NP 11/24/14 1745  Vanetta MuldersScott Zackowski, MD 11/26/14 [phone]

## 2014-11-24 NOTE — ED Notes (Signed)
Splint and arm sling placed by Ortho.

## 2014-11-24 NOTE — Progress Notes (Signed)
Orthopedic Tech Progress Note Patient Details:  Kirkland HunRobert S Pryde 06/17/1956 [id number]  Ortho Devices Type of Ortho Device: Ace wrap, Arm sling, Sugartong splint Ortho Device/Splint Location: RUE Ortho Device/Splint Interventions: Ordered, Application   Jennye MoccasinHughes, Litzy Dicker Craig 11/24/2014, 5:30 PM

## 2014-12-04 ENCOUNTER — Emergency Department (HOSPITAL_COMMUNITY): Payer: Commercial Managed Care - HMO

## 2014-12-04 ENCOUNTER — Inpatient Hospital Stay (HOSPITAL_COMMUNITY): Payer: Commercial Managed Care - HMO

## 2014-12-04 ENCOUNTER — Inpatient Hospital Stay (HOSPITAL_COMMUNITY)
Admission: EM | Admit: 2014-12-04 | Discharge: 2014-12-06 | DRG: 292 | Disposition: A | Discharge status: Home / Self Care | Payer: Commercial Managed Care - HMO | Attending: Internal Medicine | Admitting: Internal Medicine

## 2014-12-04 ENCOUNTER — Encounter (HOSPITAL_COMMUNITY): Payer: Self-pay

## 2014-12-04 DIAGNOSIS — T502X5A Adverse effect of carbonic-anhydrase inhibitors, benzothiadiazides and other diuretics, initial encounter: Secondary | ICD-10-CM | POA: Diagnosis present

## 2014-12-04 DIAGNOSIS — Z886 Allergy status to analgesic agent status: Secondary | ICD-10-CM

## 2014-12-04 DIAGNOSIS — F329 Major depressive disorder, single episode, unspecified: Secondary | ICD-10-CM | POA: Diagnosis present

## 2014-12-04 DIAGNOSIS — R7989 Other specified abnormal findings of blood chemistry: Secondary | ICD-10-CM

## 2014-12-04 DIAGNOSIS — R778 Other specified abnormalities of plasma proteins: Secondary | ICD-10-CM | POA: Insufficient documentation

## 2014-12-04 DIAGNOSIS — E785 Hyperlipidemia, unspecified: Secondary | ICD-10-CM | POA: Diagnosis present

## 2014-12-04 DIAGNOSIS — Z7982 Long term (current) use of aspirin: Secondary | ICD-10-CM

## 2014-12-04 DIAGNOSIS — E875 Hyperkalemia: Secondary | ICD-10-CM | POA: Diagnosis present

## 2014-12-04 DIAGNOSIS — G4700 Insomnia, unspecified: Secondary | ICD-10-CM | POA: Diagnosis present

## 2014-12-04 DIAGNOSIS — R739 Hyperglycemia, unspecified: Secondary | ICD-10-CM | POA: Diagnosis present

## 2014-12-04 DIAGNOSIS — E876 Hypokalemia: Secondary | ICD-10-CM | POA: Diagnosis present

## 2014-12-04 DIAGNOSIS — Z720 Tobacco use: Secondary | ICD-10-CM | POA: Diagnosis not present

## 2014-12-04 DIAGNOSIS — I2510 Atherosclerotic heart disease of native coronary artery without angina pectoris: Secondary | ICD-10-CM | POA: Diagnosis present

## 2014-12-04 DIAGNOSIS — F32A Depression, unspecified: Secondary | ICD-10-CM | POA: Insufficient documentation

## 2014-12-04 DIAGNOSIS — I10 Essential (primary) hypertension: Secondary | ICD-10-CM | POA: Diagnosis present

## 2014-12-04 DIAGNOSIS — I255 Ischemic cardiomyopathy: Secondary | ICD-10-CM | POA: Diagnosis present

## 2014-12-04 DIAGNOSIS — F17200 Nicotine dependence, unspecified, uncomplicated: Secondary | ICD-10-CM | POA: Diagnosis present

## 2014-12-04 DIAGNOSIS — F17210 Nicotine dependence, cigarettes, uncomplicated: Secondary | ICD-10-CM | POA: Diagnosis present

## 2014-12-04 DIAGNOSIS — R609 Edema, unspecified: Secondary | ICD-10-CM | POA: Diagnosis not present

## 2014-12-04 DIAGNOSIS — R0600 Dyspnea, unspecified: Secondary | ICD-10-CM

## 2014-12-04 DIAGNOSIS — R0602 Shortness of breath: Secondary | ICD-10-CM | POA: Diagnosis present

## 2014-12-04 DIAGNOSIS — I509 Heart failure, unspecified: Secondary | ICD-10-CM

## 2014-12-04 DIAGNOSIS — S52509A Unspecified fracture of the lower end of unspecified radius, initial encounter for closed fracture: Secondary | ICD-10-CM | POA: Insufficient documentation

## 2014-12-04 DIAGNOSIS — R7309 Other abnormal glucose: Secondary | ICD-10-CM | POA: Diagnosis present

## 2014-12-04 DIAGNOSIS — I252 Old myocardial infarction: Secondary | ICD-10-CM

## 2014-12-04 DIAGNOSIS — I248 Other forms of acute ischemic heart disease: Secondary | ICD-10-CM | POA: Diagnosis present

## 2014-12-04 DIAGNOSIS — J449 Chronic obstructive pulmonary disease, unspecified: Secondary | ICD-10-CM | POA: Diagnosis present

## 2014-12-04 DIAGNOSIS — M7989 Other specified soft tissue disorders: Secondary | ICD-10-CM | POA: Diagnosis present

## 2014-12-04 DIAGNOSIS — I5023 Acute on chronic systolic (congestive) heart failure: Secondary | ICD-10-CM | POA: Diagnosis present

## 2014-12-04 DIAGNOSIS — R7303 Prediabetes: Secondary | ICD-10-CM | POA: Insufficient documentation

## 2014-12-04 HISTORY — DX: Depression, unspecified: F32.A

## 2014-12-04 HISTORY — DX: Ischemic cardiomyopathy: I25.5

## 2014-12-04 HISTORY — DX: Gastro-esophageal reflux disease without esophagitis: K21.9

## 2014-12-04 HISTORY — DX: Major depressive disorder, single episode, unspecified: F32.9

## 2014-12-04 HISTORY — DX: Atherosclerotic heart disease of native coronary artery without angina pectoris: I25.10

## 2014-12-04 HISTORY — DX: Family history of other specified conditions: Z84.89

## 2014-12-04 HISTORY — DX: Reserved for inherently not codable concepts without codable children: IMO0001

## 2014-12-04 LAB — BRAIN NATRIURETIC PEPTIDE: B NATRIURETIC PEPTIDE 5: 919.9 pg/mL — AB (ref 0.0–100.0)

## 2014-12-04 LAB — BASIC METABOLIC PANEL
ANION GAP: 8 (ref 5–15)
BUN: 14 mg/dL (ref 6–20)
CALCIUM: 9 mg/dL (ref 8.9–10.3)
CO2: 25 mmol/L (ref 22–32)
Chloride: 102 mmol/L (ref 101–111)
Creatinine, Ser: 0.99 mg/dL (ref 0.61–1.24)
GFR calc Af Amer: >60 mL/min (ref >60)
GFR calc non Af Amer: >60 mL/min (ref >60)
GLUCOSE: 166 mg/dL — AB (ref 65–99)
POTASSIUM: 4.7 mmol/L (ref 3.5–5.1)
SODIUM: 135 mmol/L (ref 135–145)

## 2014-12-04 LAB — I-STAT TROPONIN, ED: Troponin i, poc: 0.04 ng/mL (ref 0.00–0.08)

## 2014-12-04 LAB — CBC WITH DIFFERENTIAL/PLATELET
BASOS ABS: 0 10*3/uL (ref 0.0–0.1)
BASOS PCT: 0 % (ref 0–1)
Eosinophils Absolute: 0.4 10*3/uL (ref 0.0–0.7)
Eosinophils Relative: 6 % — ABNORMAL HIGH (ref 0–5)
HCT: 40.6 % (ref 39.0–52.0)
Hemoglobin: 13.3 g/dL (ref 13.0–17.0)
Lymphocytes Relative: 19 % (ref 12–46)
Lymphs Abs: 1.4 10*3/uL (ref 0.7–4.0)
MCH: 30.7 pg (ref 26.0–34.0)
MCHC: 32.8 g/dL (ref 30.0–36.0)
MCV: 93.8 fL (ref 78.0–100.0)
MONO ABS: 0.7 10*3/uL (ref 0.1–1.0)
Monocytes Relative: 10 % (ref 3–12)
NEUTROS ABS: 4.7 10*3/uL (ref 1.7–7.7)
Neutrophils Relative %: 65 % (ref 43–77)
Platelets: 165 10*3/uL (ref 150–400)
RBC: 4.33 MIL/uL (ref 4.22–5.81)
RDW: 13 % (ref 11.5–15.5)
WBC: 7.3 10*3/uL (ref 4.0–10.5)

## 2014-12-04 LAB — D-DIMER, QUANTITATIVE (NOT AT ARMC): D DIMER QUANT: 2.05 ug{FEU}/mL — AB (ref 0.00–0.48)

## 2014-12-04 LAB — I-STAT ARTERIAL BLOOD GAS, ED
ACID-BASE DEFICIT: 3 mmol/L — AB (ref 0.0–2.0)
BICARBONATE: 23.1 meq/L (ref 20.0–24.0)
O2 SAT: 94 %
PCO2 ART: 41.9 mmHg (ref 35.0–45.0)
PO2 ART: 74 mmHg — AB (ref 80.0–100.0)
TCO2: 24 mmol/L (ref 0–100)
pH, Arterial: 7.347 — ABNORMAL LOW (ref 7.350–7.450)

## 2014-12-04 LAB — TROPONIN I
Troponin I: 0.08 ng/mL — ABNORMAL HIGH (ref <0.031)
Troponin I: 0.07 ng/mL — ABNORMAL HIGH (ref <0.031)
Troponin I: 0.05 ng/mL — ABNORMAL HIGH (ref <0.031)

## 2014-12-04 MED ORDER — HYDROCODONE-ACETAMINOPHEN 5-325 MG PO TABS
1.0000 | ORAL_TABLET | Freq: Four times a day (QID) | ORAL | Status: DC | PRN
  Administered 2014-12-04 – 2014-12-06 (×5): 1 via ORAL
  Filled 2014-12-04 (×5): qty 1

## 2014-12-04 MED ORDER — FLUOXETINE HCL 20 MG PO CAPS
40.0000 mg | ORAL_CAPSULE | Freq: Every day | ORAL | Status: DC
  Administered 2014-12-04 – 2014-12-06 (×3): 40 mg via ORAL
  Filled 2014-12-04 (×3): qty 2

## 2014-12-04 MED ORDER — PNEUMOCOCCAL VAC POLYVALENT 25 MCG/0.5ML IJ INJ
0.5000 mL | INJECTION | INTRAMUSCULAR | Status: AC
  Administered 2014-12-05: 0.5 mL via INTRAMUSCULAR
  Filled 2014-12-04: qty 0.5

## 2014-12-04 MED ORDER — TRAZODONE HCL 100 MG PO TABS
100.0000 mg | ORAL_TABLET | ORAL | Status: DC
  Administered 2014-12-05: 100 mg via ORAL
  Filled 2014-12-04: qty 1

## 2014-12-04 MED ORDER — ENOXAPARIN SODIUM 40 MG/0.4ML ~~LOC~~ SOLN
40.0000 mg | SUBCUTANEOUS | Status: DC
  Administered 2014-12-04 – 2014-12-06 (×3): 40 mg via SUBCUTANEOUS
  Filled 2014-12-04 (×3): qty 0.4

## 2014-12-04 MED ORDER — ACETAMINOPHEN 650 MG RE SUPP
650.0000 mg | Freq: Four times a day (QID) | RECTAL | Status: DC | PRN
Start: 2014-12-04 — End: 2014-12-06

## 2014-12-04 MED ORDER — ONDANSETRON HCL 4 MG/2ML IJ SOLN: 4.0000 mg | Freq: Four times a day (QID) | INTRAMUSCULAR | Status: DC | PRN

## 2014-12-04 MED ORDER — FUROSEMIDE 10 MG/ML IJ SOLN
40.0000 mg | INTRAMUSCULAR | Status: AC
  Administered 2014-12-04: 40 mg via INTRAVENOUS
  Filled 2014-12-04: qty 4

## 2014-12-04 MED ORDER — IPRATROPIUM-ALBUTEROL 0.5-2.5 (3) MG/3ML IN SOLN
3.0000 mL | Freq: Once | RESPIRATORY_TRACT | Status: AC
  Administered 2014-12-04: 3 mL via RESPIRATORY_TRACT
  Filled 2014-12-04: qty 3

## 2014-12-04 MED ORDER — IPRATROPIUM-ALBUTEROL 0.5-2.5 (3) MG/3ML IN SOLN: 3.0000 mL | Freq: Once | RESPIRATORY_TRACT | Status: DC

## 2014-12-04 MED ORDER — ASPIRIN EC 325 MG PO TBEC
325.0000 mg | DELAYED_RELEASE_TABLET | Freq: Every day | ORAL | Status: DC
  Administered 2014-12-04 – 2014-12-06 (×3): 325 mg via ORAL
  Filled 2014-12-04 (×3): qty 1

## 2014-12-04 MED ORDER — ADULT MULTIVITAMIN W/MINERALS CH
1.0000 | ORAL_TABLET | Freq: Every day | ORAL | Status: DC
  Administered 2014-12-04 – 2014-12-06 (×3): 1 via ORAL
  Filled 2014-12-04 (×3): qty 1

## 2014-12-04 MED ORDER — ACETAMINOPHEN 325 MG PO TABS: 650.0000 mg | ORAL_TABLET | Freq: Four times a day (QID) | ORAL | Status: DC | PRN

## 2014-12-04 MED ORDER — HYDROCODONE-ACETAMINOPHEN 5-325 MG PO TABS
1.0000 | ORAL_TABLET | Freq: Once | ORAL | Status: AC
Start: 2014-12-04 — End: 2014-12-04
  Administered 2014-12-04: 1 via ORAL
  Filled 2014-12-04: qty 1

## 2014-12-04 MED ORDER — PERFLUTREN LIPID MICROSPHERE
1.0000 mL | INTRAVENOUS | Status: AC | PRN
Start: 2014-12-04 — End: 2014-12-04
  Administered 2014-12-04: 4 mL via INTRAVENOUS
  Filled 2014-12-04: qty 10

## 2014-12-04 MED ORDER — ONDANSETRON HCL 4 MG PO TABS: 4.0000 mg | ORAL_TABLET | Freq: Four times a day (QID) | ORAL | Status: DC | PRN

## 2014-12-04 MED ORDER — IOHEXOL 350 MG/ML SOLN
100.0000 mL | Freq: Once | INTRAVENOUS | Status: AC | PRN
  Administered 2014-12-04: 100 mL via INTRAVENOUS

## 2014-12-04 MED ORDER — TERAZOSIN HCL 5 MG PO CAPS: 5.0000 mg | ORAL_CAPSULE | Freq: Every day | ORAL | Status: DC

## 2014-12-04 MED ORDER — FUROSEMIDE 10 MG/ML IJ SOLN
40.0000 mg | Freq: Two times a day (BID) | INTRAMUSCULAR | Status: DC
  Administered 2014-12-04 – 2014-12-05 (×3): 40 mg via INTRAVENOUS
  Filled 2014-12-04 (×4): qty 4

## 2014-12-04 NOTE — ED Notes (Signed)
Pt comes from home via [facility], woke up about 11 o'clock with SOB, hx of MI and stents, took wife's duoneb with minimal relief.

## 2014-12-04 NOTE — ED Provider Notes (Signed)
CSN: [id number]     Arrival date & time 12/04/14  0148 History   First MD Initiated Contact with Patient 12/04/14 0202     Chief Complaint  Patient presents with  . Shortness of Breath     (Consider location/radiation/quality/duration/timing/severity/associated sxs/prior Treatment) HPI Comments: Patient is a 59 year old male past medical history significant for hyperlipidemia, hypertension, history of MI status post 3 stent placement (2007), tobacco abuse presenting to the emergency department for acute onset shortness of breath that awoke the patient and 11 PM. He states his shortness of breath is worsened with lying flat, his wife placed him on her extra oxygen tank for improvement. He has not had any fevers, changes in his chronic cough, vomiting, diarrhea, chest pain.   Patient is a 59 y.o. male presenting with shortness of breath.  Shortness of Breath Severity:  Severe Onset quality:  Sudden   Past Medical History  Diagnosis Date  . Hyperlipidemia   . Hypertension   . Insomnia   . Myocardial infarction   . Tobacco abuse   . Dyslipidemia    Past Surgical History  Procedure Laterality Date  . Cardiac surgery      Stents 2005/2007   No family history on file. History  Substance Use Topics  . Smoking status: Current Every Day Smoker -- 1.00 packs/day    Types: Cigarettes  . Smokeless tobacco: Not on file  . Alcohol Use: 0.0 oz/week    2-5 Cans of beer per week    Review of Systems  Respiratory: Positive for shortness of breath.   Cardiovascular: Positive for leg swelling.  All other systems reviewed and are negative.     Allergies  Oxycodone-aspirin  Home Medications   Prior to Admission medications   Medication Sig Start Date End Date Taking? Authorizing Provider  aspirin 325 MG EC tablet Take 325 mg by mouth 2 (two) times daily.     Historical Provider, MD  FLUoxetine (PROZAC) 40 MG capsule Take 40 mg by mouth daily as needed (for anxiety).    Historical  Provider, MD  HYDROcodone-acetaminophen (NORCO) 7.5-325 MG per tablet Take 1 tablet by mouth every 6 (six) hours as needed for moderate pain. 11/24/14   Hope Orlene Och, NP  ibuprofen (ADVIL,MOTRIN) 200 MG tablet Take 400 mg by mouth 2 (two) times daily.    Historical Provider, MD  Multiple Vitamins-Minerals (MULTIVITAMIN WITH MINERALS) tablet Take 1 tablet by mouth daily.      Historical Provider, MD  terazosin (HYTRIN) 5 MG capsule Take 5 mg by mouth daily.    Historical Provider, MD  traZODone (DESYREL) 100 MG tablet Take 100 mg by mouth every other day.    Historical Provider, MD   BP 115/78 mmHg  Pulse 72  Temp(Src) 97.7 F (36.5 C)  Resp 16  Ht  (1.854 m)  Wt 195 lb (88.451 kg)  BMI 25.73 kg/m2  SpO2 94% Physical Exam  Constitutional: He is oriented to person, place, and time. He appears well-developed and well-nourished.  HENT:  Head: Normocephalic and atraumatic.  Right Ear: External ear normal.  Left Ear: External ear normal.  Nose: Nose normal.  Mouth/Throat: No oropharyngeal exudate.  Eyes: Conjunctivae are normal.  Neck: Neck supple.  Cardiovascular: Regular rhythm, normal heart sounds and intact distal pulses.  Tachycardia present.   Pulmonary/Chest: Breath sounds normal. Accessory muscle usage present. Tachypnea noted.  Abdominal: Soft. There is no tenderness.  Musculoskeletal: He exhibits edema (RLE > LLE without warmth, erythema).  Neurological: He  is alert and oriented to person, place, and time.  Skin: Skin is warm and dry. He is not diaphoretic.  Nursing note and vitals reviewed.   ED Course  Procedures (including critical care time) Medications  ipratropium-albuterol (DUONEB) 0.5-2.5 (3) MG/3ML nebulizer solution 3 mL (3 mLs Nebulization Given 12/04/14 0247)  HYDROcodone-acetaminophen (NORCO/VICODIN) 5-325 MG per tablet 1 tablet (1 tablet Oral Given 12/04/14 0346)  iohexol (OMNIPAQUE) 350 MG/ML injection 100 mL (100 mLs Intravenous Contrast Given 12/04/14  0445)  furosemide (LASIX) injection 40 mg (40 mg Intravenous Given 12/04/14 0626)    Labs Review Labs Reviewed  BASIC METABOLIC PANEL - Abnormal; Notable for the following:    Glucose, Bld 166 (*)    All other components within normal limits  CBC WITH DIFFERENTIAL/PLATELET - Abnormal; Notable for the following:    Eosinophils Relative 6 (*)    All other components within normal limits  BRAIN NATRIURETIC PEPTIDE - Abnormal; Notable for the following:    B Natriuretic Peptide 919.9 (*)    All other components within normal limits  D-DIMER, QUANTITATIVE (NOT AT [facility]) - Abnormal; Notable for the following:    D-Dimer, Quant 2.05 (*)    All other components within normal limits  I-STAT ARTERIAL BLOOD GAS, ED - Abnormal; Notable for the following:    pH, Arterial 7.347 (*)    pO2, Arterial 74.0 (*)    Acid-base deficit 3.0 (*)    All other components within normal limits  I-STAT TROPOININ, ED    Imaging Review Ct Angio Chest Pe W/cm &/or Wo Cm  12/04/2014   CLINICAL DATA:  Patient woke up at 11 p.m. short of breath.  EXAM: CT ANGIOGRAPHY CHEST WITH CONTRAST  TECHNIQUE: Multidetector CT imaging of the chest was performed using the standard protocol during bolus administration of intravenous contrast. Multiplanar CT image reconstructions and MIPs were obtained to evaluate the vascular anatomy.  CONTRAST:  OMNIPAQUE IOHEXOL 350 MG/ML SOLN  COMPARISON:  None.  FINDINGS: Technically adequate study with good opacification of the central and segmental pulmonary arteries. No focal filling defects. No evidence of significant pulmonary embolus.  Cardiac enlargement with dilatation of the left atrium and left ventricle. No pericardial effusion. Coronary artery calcifications. Normal caliber thoracic aorta with calcification. No significant lymphadenopathy in the chest. Esophagus is decompressed.  Motion artifact limits evaluation of the lungs. There are small bilateral pleural effusions with  infiltration or atelectasis in both lung bases. No pneumothorax.  Included portions of the upper abdominal organs demonstrate reflux of contrast material into the hepatic veins suggesting passive hepatic congestion due to heart failure. Degenerative changes in the spine. No destructive bone lesions.  Review of the MIP images confirms the above findings.  IMPRESSION: No evidence of significant pulmonary embolus. Small bilateral pleural effusions with basilar atelectasis. Cardiac enlargement with passive hepatic congestion suggesting heart failure.   Electronically Signed   By: Burman Nieves M.D.   On: 12/04/2014 05:34   Dg Chest Port 1 View  12/04/2014   CLINICAL DATA:  Shortness of breath. History of myocardial infarct and stents.  EXAM: PORTABLE CHEST - 1 VIEW  COMPARISON:  12/29/2010  FINDINGS: Normal heart size and pulmonary vascularity. Mild hyperinflation suggesting emphysema. Patchy infiltrative changes in both lung bases may represent atelectasis or pneumonia. No blunting of costophrenic angles. No pneumothorax. Mediastinal contours appear intact. Calcification of the aorta.  IMPRESSION: Emphysematous changes in the lungs. Infiltration or atelectasis in both lung bases.   Electronically Signed   By: Chrissie Noa  Andria Meuse M.D.   On: 12/04/2014 03:41     EKG Interpretation   Date/Time:  Thursday December 04 2014 01:54:33 EDT Ventricular Rate:  99 PR Interval:  165 QRS Duration: 91 QT Interval:  348 QTC Calculation: 447 R Axis:   108 Text Interpretation:  Sinus rhythm Probable left atrial enlargement  Probable lateral infarct, age indeterminate Probable anteroseptal infarct,  old Baseline wander in lead(s) V3 No significant change since last tracing  Confirmed by WARD,  DO, KRISTEN ([id number]) on 12/04/2014 2:54:08 AM      MDM   Final diagnoses:  CHF exacerbation    Filed Vitals:   12/04/14 0615  BP: 115/78  Pulse: 72  Temp:   Resp: 16   I have reviewed nursing notes, vital signs, and  all appropriate lab and imaging results if ordered as above.   Patient with likely new CHF. Cardiomegaly with bilateral small pleural effusions noted on CT scan with increased BNP. Patient is no longer requiring O2. Will start on IV Lasix and admit to the hospitalist for further management and evaluation. Patient d/w with Dr. Elesa Massed, agrees with plan.    Francee Piccolo, PA-C 12/04/14 0700  Layla Maw Ward, DO 12/04/14 [phone]

## 2014-12-04 NOTE — H&P (Addendum)
[facility] History and Physical  RUSSELL QUINNEY KLK:[id number] DOB: 02-Sep-1955 DOA: 12/04/2014  Referring physician: EDP PCP: Delia Chimes, NP   Chief Complaint: Shortness of breath  HPI: Jimmy Lewis is a 59 y.o. male with past history of CAD, multiple stents last one in 2010; ongoing tobacco use, suspected COPD, ischemic cardiomyopathy with EF of 30%, presents to the ER with the above complaints. Patient was in his usual state of health until about a week ago, subsequently started having progressive dyspnea on exertion associated with some orthopnea. He was also seen in the ER 12 days ago after a lawnmower accident when he was found to have a distal radius fracture, placed in a sling and advised orthopedics follow-up which is scheduled for next week. He's also had swelling of his right lower extremity, more than his left leg since his lawnmower episode. He currently denies any chest pain, no dizziness or palpitations. In the emergency room noted to have elevated BNP, abnormal d-dimer, CTA chest with no PE but small bilateral pleural effusions.   Review of Systems: Positives bolded Constitutional:  No weight loss, night sweats, Fevers, chills, fatigue.  HEENT:  No headaches, Difficulty swallowing,Tooth/dental problems,Sore throat,  No sneezing, itching, ear ache, nasal congestion, post nasal drip,  Cardio-vascular:  No chest pain, Orthopnea, PND, swelling in lower extremities, anasarca, dizziness, palpitations  GI:  No heartburn, indigestion, abdominal pain, nausea, vomiting, diarrhea, change in bowel habits, loss of appetite  Resp:   shortness of breath with exertion or at rest. No excess mucus, no productive cough, No non-productive cough, No coughing up of blood.No change in color of mucus.No wheezing.No chest wall deformity  Skin:  no rash or lesions.  GU:  no dysuria, change in color of urine, no urgency or frequency. No flank pain.  Musculoskeletal:  No joint  pain or swelling. No decreased range of motion. No back pain.  Psych:  No change in mood or affect. No depression or anxiety. No memory loss.   Past Medical History  Diagnosis Date  . Hyperlipidemia   . Hypertension   . Insomnia   . Myocardial infarction   . Tobacco abuse   . Dyslipidemia    Past Surgical History  Procedure Laterality Date  . Cardiac surgery      Stents 2005/2007   Social History:  reports that he has been smoking Cigarettes.  He has been smoking about 1.00 pack per day. He does not have any smokeless tobacco history on file. He reports that he drinks alcohol. He reports that he does not use illicit drugs.  Allergies  Allergen Reactions  . Oxycodone-Aspirin     PERCOCET/ PERCODAN= cannot function     family history: Father died of MI at 6, brother had first MI and 50s and is status post CABG  Prior to Admission medications   Medication Sig Start Date End Date Taking? Authorizing Provider  aspirin 325 MG EC tablet Take 325 mg by mouth 2 (two) times daily.     Historical Provider, MD  FLUoxetine (PROZAC) 40 MG capsule Take 40 mg by mouth daily as needed (for anxiety).    Historical Provider, MD  HYDROcodone-acetaminophen (NORCO) 7.5-325 MG per tablet Take 1 tablet by mouth every 6 (six) hours as needed for moderate pain. 11/24/14   Hope Bunnie Pion, NP  ibuprofen (ADVIL,MOTRIN) 200 MG tablet Take 400 mg by mouth 2 (two) times daily.    Historical Provider, MD  Multiple Vitamins-Minerals (MULTIVITAMIN WITH MINERALS) tablet Take 1 tablet  by mouth daily.      Historical Provider, MD  terazosin (HYTRIN) 5 MG capsule Take 5 mg by mouth daily.    Historical Provider, MD  traZODone (DESYREL) 100 MG tablet Take 100 mg by mouth every other day.    Historical Provider, MD   Physical Exam: Filed Vitals:   12/04/14 0630 12/04/14 0645 12/04/14 0700 12/04/14 0753  BP: 123/86 121/78 124/85 122/83  Pulse: 73 71 70 73  Temp:    97.8 F (36.6 C)  TempSrc:    Oral  Resp: 17 18  16 18   Height:    6' 1"  (1.854 m)  Weight:    87.363 kg (192 lb 9.6 oz)  SpO2: 94% 95% 95% 96%    Wt Readings from Last 3 Encounters:  12/04/14 87.363 kg (192 lb 9.6 oz)  11/24/14 88.451 kg (195 lb)  07/29/10 81.194 kg (179 lb)    General:  Appears calm and comfortable, no distress Eyes: PERRL, normal lids, irises & conjunctiva ENT: grossly normal hearing, lips & tongue Neck: no LAD, masses or thyromegaly Cardiovascular: RRR, no m/r/g. Right lower extremity edema 1+ and trace in left Telemetry: SR, no arrhythmias  Respiratory: Fine bilateral basilar crackles Abdomen: soft, ntnd Skin: no rash or induration seen on limited exam Musculoskeletal: grossly normal tone BUE/BLE, right forearm in sling Psychiatric: grossly normal mood and affect, speech fluent and appropriate Neurologic: grossly non-focal.          Labs on Admission:  Basic Metabolic Panel:  Recent Labs Lab 12/04/14 0253  NA 135  K 4.7  CL 102  CO2 25  GLUCOSE 166*  BUN 14  CREATININE 0.99  CALCIUM 9.0   Liver Function Tests: No results for input(s): AST, ALT, ALKPHOS, BILITOT, PROT, ALBUMIN in the last 168 hours. No results for input(s): LIPASE, AMYLASE in the last 168 hours. No results for input(s): AMMONIA in the last 168 hours. CBC:  Recent Labs Lab 12/04/14 0253  WBC 7.3  NEUTROABS 4.7  HGB 13.3  HCT 40.6  MCV 93.8  PLT 165   Cardiac Enzymes: No results for input(s): CKTOTAL, CKMB, CKMBINDEX, TROPONINI in the last 168 hours.  BNP (last 3 results)  Recent Labs  12/04/14 0253  BNP 919.9*    ProBNP (last 3 results) No results for input(s): PROBNP in the last 8760 hours.  CBG: No results for input(s): GLUCAP in the last 168 hours.  Radiological Exams on Admission: Ct Angio Chest Pe W/cm &/or Wo Cm  12/04/2014   CLINICAL DATA:  Patient woke up at 11 p.m. short of breath.  EXAM: CT ANGIOGRAPHY CHEST WITH CONTRAST  TECHNIQUE: Multidetector CT imaging of the chest was performed using  the standard protocol during bolus administration of intravenous contrast. Multiplanar CT image reconstructions and MIPs were obtained to evaluate the vascular anatomy.  CONTRAST:  159m OMNIPAQUE IOHEXOL 350 MG/ML SOLN  COMPARISON:  None.  FINDINGS: Technically adequate study with good opacification of the central and segmental pulmonary arteries. No focal filling defects. No evidence of significant pulmonary embolus.  Cardiac enlargement with dilatation of the left atrium and left ventricle. No pericardial effusion. Coronary artery calcifications. Normal caliber thoracic aorta with calcification. No significant lymphadenopathy in the chest. Esophagus is decompressed.  Motion artifact limits evaluation of the lungs. There are small bilateral pleural effusions with infiltration or atelectasis in both lung bases. No pneumothorax.  Included portions of the upper abdominal organs demonstrate reflux of contrast material into the hepatic veins suggesting passive hepatic congestion  due to heart failure. Degenerative changes in the spine. No destructive bone lesions.  Review of the MIP images confirms the above findings.  IMPRESSION: No evidence of significant pulmonary embolus. Small bilateral pleural effusions with basilar atelectasis. Cardiac enlargement with passive hepatic congestion suggesting heart failure.   Electronically Signed   By: Lucienne Capers M.D.   On: 12/04/2014 05:34   Dg Chest Port 1 View  12/04/2014   CLINICAL DATA:  Shortness of breath. History of myocardial infarct and stents.  EXAM: PORTABLE CHEST - 1 VIEW  COMPARISON:  12/29/2010  FINDINGS: Normal heart size and pulmonary vascularity. Mild hyperinflation suggesting emphysema. Patchy infiltrative changes in both lung bases may represent atelectasis or pneumonia. No blunting of costophrenic angles. No pneumothorax. Mediastinal contours appear intact. Calcification of the aorta.  IMPRESSION: Emphysematous changes in the lungs. Infiltration or  atelectasis in both lung bases.   Electronically Signed   By: Lucienne Capers M.D.   On: 12/04/2014 03:41    EKG: Independently reviewed, normal sinus rhythm, septal and lateral Q waves  Assessment/Plan  1. Acute on chronic systolic CHF/ ischemic cardiomyopathy -IV Lasix 40 mg every 12 -Monitor I/O, daily weights, B met -Check 2-D echocardiogram -Cycle troponins  2. Right> left: Leg swelling -Check lower extremity venous duplex -Most likely due to #1  3. Recent Displaced distal radius fracture -Continue sling, follow-up with orthopedics  4. History of CAD -History of multiple PCI's and stents, last in 2010 to ostium of the left anterior descending artery -Last Myoview in 2012: Without inducible ischemia -Continue aspirin, unclear why not on BB, could add low dose coreg as CHF improves  5. TOBACCO ABUSE/COPD -stable, Counseled  6.  Hyperglycemia -Check HbA1c  Code Status: Full Code DVT Prophylaxis: lovenox Family Communication: none at bedside Disposition Plan: inpatient  Time spent: 42mn  Jhayla Podgorski [facility] Pager [phone]

## 2014-12-04 NOTE — Progress Notes (Signed)
  Echocardiogram 2D Echocardiogram with Definity has been performed.  Jimmy Lewis 12/04/2014, 2:01 PM

## 2014-12-04 NOTE — Progress Notes (Signed)
*  PRELIMINARY RESULTS* Vascular Ultrasound  Bilateral Lower Extremity Venous Duplex Completed.   PRELIMINARY RESULTS  PRELIMINARY RESULTS  PRELIMINARY RESULTS Preliminary Report  Right: No evidence of DVT, Superficial Thrombosis, or Backer's cyst. Left: No evidence of DVT, Superficial Thrombosis, or Backer's cyst.  Leta Jungling M 12/04/2014, 12:03 PM

## 2014-12-04 NOTE — Progress Notes (Signed)
Pt arrived lfrom ED to the floor via bed,   Oriented to his room. Call bell at reach. Instructed to call if needed. Verbalized understanding.  Also notified Dr.  Jomarie Longs of pt's arrival.  Will continue to monitor.  Amanda Pea, Charity fundraiser.

## 2014-12-05 DIAGNOSIS — R778 Other specified abnormalities of plasma proteins: Secondary | ICD-10-CM | POA: Insufficient documentation

## 2014-12-05 DIAGNOSIS — I5023 Acute on chronic systolic (congestive) heart failure: Principal | ICD-10-CM

## 2014-12-05 DIAGNOSIS — F32A Depression, unspecified: Secondary | ICD-10-CM | POA: Insufficient documentation

## 2014-12-05 DIAGNOSIS — Z720 Tobacco use: Secondary | ICD-10-CM

## 2014-12-05 DIAGNOSIS — R739 Hyperglycemia, unspecified: Secondary | ICD-10-CM

## 2014-12-05 DIAGNOSIS — S52501A Unspecified fracture of the lower end of right radius, initial encounter for closed fracture: Secondary | ICD-10-CM

## 2014-12-05 DIAGNOSIS — R7989 Other specified abnormal findings of blood chemistry: Secondary | ICD-10-CM | POA: Insufficient documentation

## 2014-12-05 DIAGNOSIS — S52509A Unspecified fracture of the lower end of unspecified radius, initial encounter for closed fracture: Secondary | ICD-10-CM | POA: Insufficient documentation

## 2014-12-05 DIAGNOSIS — F329 Major depressive disorder, single episode, unspecified: Secondary | ICD-10-CM | POA: Insufficient documentation

## 2014-12-05 LAB — CBC
HCT: 42.1 % (ref 39.0–52.0)
Hemoglobin: 13.9 g/dL (ref 13.0–17.0)
MCH: 31.2 pg (ref 26.0–34.0)
MCHC: 33 g/dL (ref 30.0–36.0)
MCV: 94.4 fL (ref 78.0–100.0)
PLATELETS: 181 10*3/uL (ref 150–400)
RBC: 4.46 MIL/uL (ref 4.22–5.81)
RDW: 13 % (ref 11.5–15.5)
WBC: 8.3 10*3/uL (ref 4.0–10.5)

## 2014-12-05 LAB — COMPREHENSIVE METABOLIC PANEL
ALT: 20 U/L (ref 17–63)
AST: 20 U/L (ref 15–41)
Albumin: 3.3 g/dL — ABNORMAL LOW (ref 3.5–5.0)
Alkaline Phosphatase: 61 U/L (ref 38–126)
Anion gap: 9 (ref 5–15)
BUN: 14 mg/dL (ref 6–20)
CO2: 32 mmol/L (ref 22–32)
Calcium: 8.8 mg/dL — ABNORMAL LOW (ref 8.9–10.3)
Chloride: 95 mmol/L — ABNORMAL LOW (ref 101–111)
Creatinine, Ser: 1.09 mg/dL (ref 0.61–1.24)
GFR calc Af Amer: >60 mL/min (ref >60)
GFR calc non Af Amer: >60 mL/min (ref >60)
Glucose, Bld: 116 mg/dL — ABNORMAL HIGH (ref 65–99)
Potassium: 3.4 mmol/L — ABNORMAL LOW (ref 3.5–5.1)
SODIUM: 136 mmol/L (ref 135–145)
TOTAL PROTEIN: 6.1 g/dL — AB (ref 6.5–8.1)
Total Bilirubin: 0.7 mg/dL (ref 0.3–1.2)

## 2014-12-05 LAB — HEMOGLOBIN A1C
HEMOGLOBIN A1C: 6 % — AB (ref 4.8–5.6)
Mean Plasma Glucose: 126 mg/dL

## 2014-12-05 MED ORDER — CARVEDILOL 3.125 MG PO TABS
3.1250 mg | ORAL_TABLET | Freq: Two times a day (BID) | ORAL | Status: DC
  Administered 2014-12-05 – 2014-12-06 (×2): 3.125 mg via ORAL
  Filled 2014-12-05 (×4): qty 1

## 2014-12-05 MED ORDER — FUROSEMIDE 10 MG/ML IJ SOLN
20.0000 mg | Freq: Two times a day (BID) | INTRAMUSCULAR | Status: DC
  Administered 2014-12-05: 20 mg via INTRAVENOUS
  Filled 2014-12-05 (×2): qty 2

## 2014-12-05 MED ORDER — LOSARTAN POTASSIUM 25 MG PO TABS
25.0000 mg | ORAL_TABLET | Freq: Every day | ORAL | Status: DC
  Administered 2014-12-05 – 2014-12-06 (×2): 25 mg via ORAL
  Filled 2014-12-05 (×2): qty 1

## 2014-12-05 MED ORDER — POTASSIUM CHLORIDE CRYS ER 20 MEQ PO TBCR
40.0000 meq | EXTENDED_RELEASE_TABLET | Freq: Every day | ORAL | Status: DC
  Administered 2014-12-05 – 2014-12-06 (×2): 40 meq via ORAL
  Filled 2014-12-05 (×2): qty 2

## 2014-12-05 NOTE — Research (Signed)
[email or web address]  Informed Consent   Subject Name: Jimmy Lewis  Subject met inclusion and exclusion criteria.  The informed consent form, study requirements and expectations were reviewed with the subject and questions and concerns were addressed prior to the signing of the consent form.  The subject verbalized understanding of the trail requirements.  The subject agreed to participate in the [email or web address]  trial and signed the informed consent.  The informed consent was obtained prior to performance of any protocol-specific procedures for the subject.  A copy of the signed informed consent was given to the subject and a copy was placed in the subject's medical record.  Sandie Ano 12/05/2014, 11:00

## 2014-12-05 NOTE — Progress Notes (Signed)
[facility] PROGRESS NOTE Interim History: 59 y.o. male with past history of CAD, multiple stents last one in 2010; ongoing tobacco use, suspected COPD, ischemic cardiomyopathy with EF of 30%, presents to the ER with the above complaints. Patient was in his usual state of health until about a week ago, subsequently started having progressive dyspnea on exertion associated with some orthopnea. He was also seen in the ER 12 days ago after a lawnmower accident when he was found to have a distal radius fracture, placed in a sling and advised orthopedics follow-up which is scheduled for next week.  Filed Weights   12/04/14 0156 12/04/14 0753 12/05/14 0531  Weight: 88.451 kg (195 lb) 87.363 kg (192 lb 9.6 oz) 83.144 kg (183 lb 4.8 oz)        Intake/Output Summary (Last 24 hours) at 12/05/14 1529 Last data filed at 12/05/14 1422  Gross per 24 hour  Intake   1615 ml  Output   1300 ml  Net    315 ml    Assessment/Plan: 1-acute on chronic systolic CHF (congestive heart failure): 2-D echo has demonstrated an ejection fraction of 25-30% -Patient with prior history of ischemic cardiomyopathy. -Denies chest pain -Significant improvement after appendectomy Lasix -Will continue daily weights, strict I's and O's on the patient has been educated about compliance and significance of sodium in his diet. (He has been instructed to follow less than 2 g of sodium per day) -Will continue with IV Lasix and will change to once a day and will add Coreg and ARB given his low ejection fraction -Patient with 8 pounds off since admission  2-mild elevation in his troponins: Secondary to demand ischemia in the setting of acute on chronic CHF exacerbation -Patient denies chest pain -Will add coreg and ARB -continue ASA  3-depression: Continue Prozac and trazodone  4-hyperkalemia: Secondary to diuresis. Will replete and follow electrolytes as needed  5-hyperglycemia: Secondary to prediabetes with A1c of  6.0 -Patient has been advised to follow a low carbohydrate diet -Will require close follow-up by PCP to determine initiation of hypoglycemic regimen   6-distal radius: Right arm -Patient with a sling in place and appointment next week with orthopedic service. -Continue as needed analgesics  7-TOBACCO ABUSE: Cessation counseling provided. Patient has declined the use of nicotine patch.   Code Status: Full code Family Communication: Wife at bedside Disposition Plan: Continue inpatient with IV Lasix therapy  Consultants:  None  Procedures: ECHO:  - Left ventricle: distal anterior wall, septal, apical and inferor wall hypokinesis The cavity size was severely dilated. Wall thickness was normal. Systolic function was severely reduced. The estimated ejection fraction was in the range of 25% to 30%. - Mitral valve: There was mild regurgitation. - Left atrium: The atrium was mildly dilated. - Atrial septum: No defect or patent foramen ovale was identified.  Antibiotics:  None  HPI/Subjective: Significant improvement overnight with IV Lasix; patient denies chest pain, nausea, vomiting and orthopnea.  Objective: Filed Vitals:   12/05/14 0130 12/05/14 0531 12/05/14 0914 12/05/14 1330  BP: 100/53 99/59 113/73 112/78  Pulse: 51 70 82 76  Temp: 98 F (36.7 C) 98.1 F (36.7 C) 97.9 F (36.6 C) 98.5 F (36.9 C)  TempSrc: Oral Oral Oral Oral  Resp: Height:      Weight:  83.144 kg (183 lb 4.8 oz)    SpO2: 95% 98% 99% 100%     Exam:  General: Alert, awake, oriented x3, in no  acute distress. Patient denies chest pain and endorses that his breathing is pretty much back to baseline now.  HEENT: No bruits, no goiter. No JVD appreciated on exam Heart: Regular rate and rhythm, no rubs, no gallops; trace edema bilaterally Lungs: Improved air movement, no wheezing, decreased breath sounds at the bases without frank crackles Abdomen: Soft, nontender, nondistended,  positive bowel sounds.  Neuro: Grossly intact, nonfocal.   Data Reviewed: Basic Metabolic Panel:  Recent Labs Lab 12/04/14 0253 12/05/14 0254  NA 135 136  K 4.7 3.4*  CL 102 95*  CO2 25 32  GLUCOSE 166* 116*  BUN 14 14  CREATININE 0.99 1.09  CALCIUM 9.0 8.8*   Liver Function Tests:  Recent Labs Lab 12/05/14 0254  AST 20  ALT 20  ALKPHOS 61  BILITOT 0.7  PROT 6.1*  ALBUMIN 3.3*   CBC:  Recent Labs Lab 12/04/14 0253 12/05/14 0254  WBC 7.3 8.3  NEUTROABS 4.7  --   HGB 13.3 13.9  HCT 40.6 42.1  MCV 93.8 94.4  PLT 165 181   Cardiac Enzymes:  Recent Labs Lab 12/04/14 0851 12/04/14 1405 12/04/14 1946  TROPONINI 0.08* 0.07* 0.05*   BNP (last 3 results)  Recent Labs  12/04/14 0253  BNP 919.9*    Studies: Ct Angio Chest Pe W/cm &/or Wo Cm  12/04/2014   CLINICAL DATA:  Patient woke up at 11 p.m. short of breath.  EXAM: CT ANGIOGRAPHY CHEST WITH CONTRAST  TECHNIQUE: Multidetector CT imaging of the chest was performed using the standard protocol during bolus administration of intravenous contrast. Multiplanar CT image reconstructions and MIPs were obtained to evaluate the vascular anatomy.  CONTRAST:  OMNIPAQUE IOHEXOL 350 MG/ML SOLN  COMPARISON:  None.  FINDINGS: Technically adequate study with good opacification of the central and segmental pulmonary arteries. No focal filling defects. No evidence of significant pulmonary embolus.  Cardiac enlargement with dilatation of the left atrium and left ventricle. No pericardial effusion. Coronary artery calcifications. Normal caliber thoracic aorta with calcification. No significant lymphadenopathy in the chest. Esophagus is decompressed.  Motion artifact limits evaluation of the lungs. There are small bilateral pleural effusions with infiltration or atelectasis in both lung bases. No pneumothorax.  Included portions of the upper abdominal organs demonstrate reflux of contrast material into the hepatic veins  suggesting passive hepatic congestion due to heart failure. Degenerative changes in the spine. No destructive bone lesions.  Review of the MIP images confirms the above findings.  IMPRESSION: No evidence of significant pulmonary embolus. Small bilateral pleural effusions with basilar atelectasis. Cardiac enlargement with passive hepatic congestion suggesting heart failure.   Electronically Signed   By: Burman Nieves M.D.   On: 12/04/2014 05:34   Dg Chest Port 1 View  12/04/2014   CLINICAL DATA:  Shortness of breath. History of myocardial infarct and stents.  EXAM: PORTABLE CHEST - 1 VIEW  COMPARISON:  12/29/2010  FINDINGS: Normal heart size and pulmonary vascularity. Mild hyperinflation suggesting emphysema. Patchy infiltrative changes in both lung bases may represent atelectasis or pneumonia. No blunting of costophrenic angles. No pneumothorax. Mediastinal contours appear intact. Calcification of the aorta.  IMPRESSION: Emphysematous changes in the lungs. Infiltration or atelectasis in both lung bases.   Electronically Signed   By: Burman Nieves M.D.   On: 12/04/2014 03:41    Scheduled Meds: . aspirin  325 mg Oral Daily  . carvedilol  3.125 mg Oral BID WC  . enoxaparin (LOVENOX) injection  40 mg Subcutaneous Q24H  .  FLUoxetine  40 mg Oral Daily  . furosemide  20 mg Intravenous Q12H  . losartan  25 mg Oral Daily  . multivitamin with minerals  1 tablet Oral Daily  . potassium chloride  40 mEq Oral Daily  . traZODone  100 mg Oral Q48H   Continuous Infusions:    Vassie Loll  [facility] Pager [phone]. If 7PM-7AM, please contact night-coverage at [email or web address], password [facility] 12/05/2014, 3:29 PM  LOS: 1 day

## 2014-12-05 NOTE — Plan of Care (Signed)
Problem: Phase I Progression Outcomes Goal: EF % per last Echo/documented,Core Reminder form on chart Outcome: Completed/Met Date Met:  12/05/14 EF performed on 12/04/2014 EF% result - 25-30%

## 2014-12-06 DIAGNOSIS — R609 Edema, unspecified: Secondary | ICD-10-CM

## 2014-12-06 DIAGNOSIS — R7309 Other abnormal glucose: Secondary | ICD-10-CM

## 2014-12-06 DIAGNOSIS — R7303 Prediabetes: Secondary | ICD-10-CM | POA: Insufficient documentation

## 2014-12-06 LAB — BRAIN NATRIURETIC PEPTIDE: B Natriuretic Peptide: 353.6 pg/mL — ABNORMAL HIGH (ref 0.0–100.0)

## 2014-12-06 MED ORDER — LOSARTAN POTASSIUM 25 MG PO TABS: 25.0000 mg | ORAL_TABLET | Freq: Every day | ORAL | Status: AC

## 2014-12-06 MED ORDER — FUROSEMIDE 20 MG PO TABS: 20.0000 mg | ORAL_TABLET | Freq: Every day | ORAL | Status: AC

## 2014-12-06 MED ORDER — CARVEDILOL 3.125 MG PO TABS: 3.1250 mg | ORAL_TABLET | Freq: Two times a day (BID) | ORAL | Status: DC

## 2014-12-06 NOTE — Discharge Summary (Signed)
Physician Discharge Summary  Jimmy Lewis UJW:[id number] DOB: 17-Aug-1955 DOA: 12/04/2014  PCP: Tomi Bamberger, NP  Admit date: 12/04/2014 Discharge date: 12/06/2014  Time spent: >30 minutes  Recommendations for Outpatient Follow-up:  1. Arrange outpatient follow up with cardiology for evaluation for ICD 2. Repeat BMET to follow electrolytes and renal function   Discharge Diagnoses:  Active Problems:   TOBACCO ABUSE   Coronary atherosclerosis   CHF (congestive heart failure)   Hyperglycemia   Acute on chronic systolic heart failure   Elevated troponin   Depression   Distal radius fracture   Swelling   Pre-diabetes   Discharge Condition: stable and improved. Discharge home with instructions to follow with PCP in 10 days. Patient needs outpatient follow up with cardiology and evaluation for ICD.  Diet recommendation: heart healthy (low sodium) and low carbohydrates diet  Filed Weights   12/04/14 0753 12/05/14 0531 12/06/14 0505  Weight: 87.363 kg (192 lb 9.6 oz) 83.144 kg (183 lb 4.8 oz) 82.736 kg (182 lb 6.4 oz)    History of present illness:  59 y.o. male with past history of CAD, multiple stents last one in 2010; ongoing tobacco use, suspected COPD, ischemic cardiomyopathy with EF of 30%, presents to the ER with the above complaints. Patient was in his usual state of health until about a week ago, subsequently started having progressive dyspnea on exertion associated with some orthopnea. He was also seen in the ER 12 days ago after a lawnmower accident when he was found to have a distal radius fracture, placed in a sling and advised orthopedics follow-up which is scheduled for next week.  Hospital Course:  1-acute on chronic systolic CHF (congestive heart failure): 2-D echo has demonstrated an ejection fraction of 25-30% -Patient with prior history of ischemic cardiomyopathy. -Denies chest pain, SOB or orthopnea at discharge -Significant improvement after IV  Lasix -encourage to follow low sodium diet and daily weights -will discharge on lasix  daily -will continue coreg and add Cozaar -The ReDS reading at discharge 19 0BNP doun to 300 range  2-mild elevation in his troponins: Secondary to demand ischemia in the setting of acute on chronic CHF exacerbation -Patient denies chest pain -Will discharge on coreg and ARB -continue ASA  3-depression: Continue Prozac and trazodone  4-hypokalemia: Secondary to diuresis.  -repleted -expecting to be normal now that he is on ARB -BMET during follow up visit to be repeated  5-Hyperglycemia: Secondary to prediabetes with A1c of 6.0 -Patient has been advised to follow a low carbohydrate diet -Will require close follow-up by PCP to determine initiation of hypoglycemic regimen   6-distal radius: Right arm -Patient with a sling in place and appointment next week with orthopedic service. -Continue as needed analgesics  7-TOBACCO ABUSE: Cessation counseling provided. Patient has declined the use of nicotine patch.    Procedures: ECHO:  - Left ventricle: distal anterior wall, septal, apical and inferor wall hypokinesis The cavity size was severely dilated. Wall thickness was normal. Systolic function was severely reduced. The estimated ejection fraction was in the range of 25% to 30%. - Mitral valve: There was mild regurgitation. - Left atrium: The atrium was mildly dilated. - Atrial septum: No defect or patent foramen ovale was identified.  Consultations:  None   Discharge Exam: Filed Vitals:   12/06/14 0505  BP: 111/75  Pulse: 82  Temp: 98.3 F (36.8 C)  Resp: 16   General: Alert, awake, oriented x3, in no acute distress. Patient denies chest pain and endorses that  his breathing is pretty much back to baseline now.  HEENT: No bruits, no goiter. No JVD appreciated on exam Heart: Regular rate and rhythm, no rubs, no gallops; trace edema bilaterally Lungs: Improved air  movement, no wheezing, decreased breath sounds at the bases without frank crackles Abdomen: Soft, nontender, nondistended, positive bowel sounds.  Neuro: Grossly intact, nonfocal.   Discharge Instructions   Discharge Instructions    Diet - low sodium heart healthy    Complete by:  As directed      Discharge instructions    Complete by:  As directed   Take medications as prescribed Follow low sodium diet (less than 2 gram daily) Check your weight on daily basis (contat PCP if weight is up > 3 pounds overnight and/or > 5 pounds in a week) Arrange follow up with PCP in 10 days Stop smoking Follow with orthopedic service as previously instructed          Current Discharge Medication List    START taking these medications   Details  losartan (COZAAR) 25 MG tablet Take 1 tablet (25 mg total) by mouth daily. Qty: 30 tablet, Refills: 1      CONTINUE these medications which have CHANGED   Details  carvedilol (COREG) 3.125 MG tablet Take 1 tablet (3.125 mg total) by mouth 2 (two) times daily. Qty: 60 tablet, Refills: 1      CONTINUE these medications which have NOT CHANGED   Details  albuterol (VENTOLIN HFA) 108 (90 BASE) MCG/ACT inhaler Inhale 2 puffs into the lungs every 6 (six) hours as needed for wheezing or shortness of breath.     aspirin 325 MG EC tablet Take 325 mg by mouth 2 (two) times daily.     atorvastatin (LIPITOR) 10 MG tablet Take 10 mg by mouth at bedtime.    diphenhydrAMINE (BENADRYL) spray Apply 1 spray topically daily as needed for itching.    famotidine-calcium carbonate-magnesium hydroxide (PEPCID COMPLETE) 10-800-165 MG CHEW chewable tablet Chew 1 tablet by mouth at bedtime.    FLUoxetine (PROZAC) 40 MG capsule Take 40 mg by mouth daily.    HYDROcodone-acetaminophen (NORCO) 7.5-325 MG per tablet Take 1 tablet by mouth every 6 (six) hours as needed for moderate pain. Qty: 30 tablet, Refills: 0    ibuprofen (ADVIL,MOTRIN) 200 MG tablet Take 200 mg by  mouth 2 (two) times daily.    Multiple Vitamins-Minerals (MULTIVITAMIN WITH MINERALS) tablet Take 1 tablet by mouth daily.      nitroGLYCERIN (NITROSTAT) 0.4 MG SL tablet Place 0.4 mg under the tongue every 5 (five) minutes x 3 doses as needed.    omega-3 fish oil (MAXEPA) 1000 MG CAPS capsule Take 2 g by mouth daily.    traZODone (DESYREL) 100 MG tablet Take 100 mg by mouth at bedtime.       STOP taking these medications     lisinopril (PRINIVIL,ZESTRIL) 10 MG tablet        Allergies  Allergen Reactions  . Oxycodone-Aspirin Anaphylaxis    Other reaction(s): Confusion  . Oxycodone-Aspirin     PERCOCET/ PERCODAN= cannot function   Follow-up Information    Follow up with Tomi Bamberger, NP. Schedule an appointment as soon as possible for a visit in 10 days.   Specialty:  Nurse Practitioner   Contact information:   [address] [place] [address] [phone]       The results of significant diagnostics from this hospitalization (including imaging, microbiology, ancillary and laboratory) are listed below for reference.  Significant Diagnostic Studies: Dg Wrist Complete Right  11/24/2014   CLINICAL DATA:  Right wrist injury and extremity laceration while mowing the lawn today. Lateral right wrist pain radiating to the elbow. Initial encounter.  EXAM: RIGHT WRIST - COMPLETE 3+ VIEW  COMPARISON:  None.  FINDINGS: There is a mildly impacted fracture of the distal radius with mild dorsal displacement and slight dorsal angulation. The fracture extends into the distal radioulnar joint without definite involvement of the radiocarpal articular surface. The distal ulna appears intact. No dislocation is seen. There is overlying soft tissue swelling. Three fixation screws are noted in the shaft of the fifth metacarpal. Degenerative joint space narrowing and subchondral sclerosis are noted at the scaphoid- trapezium articulation.  IMPRESSION: Mildly displaced fracture of the distal  radius.   Electronically Signed   By: Sebastian Ache   On: 11/24/2014 15:46   Ct Angio Chest Pe W/cm &/or Wo Cm  12/04/2014   CLINICAL DATA:  Patient woke up at 11 p.m. short of breath.  EXAM: CT ANGIOGRAPHY CHEST WITH CONTRAST  TECHNIQUE: Multidetector CT imaging of the chest was performed using the standard protocol during bolus administration of intravenous contrast. Multiplanar CT image reconstructions and MIPs were obtained to evaluate the vascular anatomy.  CONTRAST:  OMNIPAQUE IOHEXOL 350 MG/ML SOLN  COMPARISON:  None.  FINDINGS: Technically adequate study with good opacification of the central and segmental pulmonary arteries. No focal filling defects. No evidence of significant pulmonary embolus.  Cardiac enlargement with dilatation of the left atrium and left ventricle. No pericardial effusion. Coronary artery calcifications. Normal caliber thoracic aorta with calcification. No significant lymphadenopathy in the chest. Esophagus is decompressed.  Motion artifact limits evaluation of the lungs. There are small bilateral pleural effusions with infiltration or atelectasis in both lung bases. No pneumothorax.  Included portions of the upper abdominal organs demonstrate reflux of contrast material into the hepatic veins suggesting passive hepatic congestion due to heart failure. Degenerative changes in the spine. No destructive bone lesions.  Review of the MIP images confirms the above findings.  IMPRESSION: No evidence of significant pulmonary embolus. Small bilateral pleural effusions with basilar atelectasis. Cardiac enlargement with passive hepatic congestion suggesting heart failure.   Electronically Signed   By: Burman Nieves M.D.   On: 12/04/2014 05:34   Dg Chest Port 1 View  12/04/2014   CLINICAL DATA:  Shortness of breath. History of myocardial infarct and stents.  EXAM: PORTABLE CHEST - 1 VIEW  COMPARISON:  12/29/2010  FINDINGS: Normal heart size and pulmonary vascularity. Mild  hyperinflation suggesting emphysema. Patchy infiltrative changes in both lung bases may represent atelectasis or pneumonia. No blunting of costophrenic angles. No pneumothorax. Mediastinal contours appear intact. Calcification of the aorta.  IMPRESSION: Emphysematous changes in the lungs. Infiltration or atelectasis in both lung bases.   Electronically Signed   By: Burman Nieves M.D.   On: 12/04/2014 03:41   Dg Hand Complete Right  11/24/2014   CLINICAL DATA:  Right hand injury today with acute medial right hand pain. Initial encounter.  EXAM: RIGHT HAND - COMPLETE 3+ VIEW  COMPARISON:  None  FINDINGS: There is no evidence of acute fracture, subluxation or dislocation.  Three screws within the fifth metacarpal noted.  Degenerative changes at the scaphoid -trapezium articulation and first carpometacarpal joint noted.  Calcification within the radiocarpal joint and at the scapholunate interval noted.  Two metallic foreign bodies within the soft tissues just medial and anterior to the second metatarsal head noted.  IMPRESSION: Two metallic foreign bodies within the soft tissues - just medial and anterior to the second metatarsal head, of uncertain chronicity.  No evidence of acute bony abnormality.  Degenerative changes/ chondrocalcinosis within the wrist.   Electronically Signed   By: Harmon Pier M.D.   On: 11/24/2014 15:45   Labs: Basic Metabolic Panel:  Recent Labs Lab 12/04/14 0253 12/05/14 0254  NA 135 136  K 4.7 3.4*  CL 102 95*  CO2 25 32  GLUCOSE 166* 116*  BUN 14 14  CREATININE 0.99 1.09  CALCIUM 9.0 8.8*   Liver Function Tests:  Recent Labs Lab 12/05/14 0254  AST 20  ALT 20  ALKPHOS 61  BILITOT 0.7  PROT 6.1*  ALBUMIN 3.3*   CBC:  Recent Labs Lab 12/04/14 0253 12/05/14 0254  WBC 7.3 8.3  NEUTROABS 4.7  --   HGB 13.3 13.9  HCT 40.6 42.1  MCV 93.8 94.4  PLT 165 181   Cardiac Enzymes:  Recent Labs Lab 12/04/14 0851 12/04/14 1405 12/04/14 1946  TROPONINI  0.08* 0.07* 0.05*   BNP: BNP (last 3 results)  Recent Labs  12/04/14 0253 12/06/14 0541  BNP 919.9* 353.6*    Signed:  Vassie Loll  [facility] 12/06/2014, 10:05 AM

## 2014-12-06 NOTE — Progress Notes (Signed)
ReDS Vest Discharge Study  Results of ReDS reading  Your patient is in the Unblinded arm of the Vest at Discharge study.  The ReDS reading is:  67   Your patient is ok for discharge.   Thank You   The research team

## 2014-12-30 ENCOUNTER — Encounter (HOSPITAL_COMMUNITY): Payer: Self-pay

## 2014-12-30 ENCOUNTER — Ambulatory Visit (HOSPITAL_COMMUNITY)
Admission: RE | Admit: 2014-12-30 | Discharge: 2014-12-30 | Disposition: A | Discharge status: Home / Self Care | Payer: Commercial Managed Care - HMO | Source: Ambulatory Visit | Attending: Cardiology | Admitting: Cardiology

## 2014-12-30 ENCOUNTER — Telehealth (HOSPITAL_COMMUNITY): Payer: Self-pay | Admitting: Cardiology

## 2014-12-30 ENCOUNTER — Encounter (HOSPITAL_COMMUNITY): Payer: Self-pay | Admitting: Cardiology

## 2014-12-30 VITALS — BP 104/68 | HR 56 | Ht 73.0 in | Wt 189.8 lb

## 2014-12-30 DIAGNOSIS — I2510 Atherosclerotic heart disease of native coronary artery without angina pectoris: Secondary | ICD-10-CM | POA: Diagnosis not present

## 2014-12-30 DIAGNOSIS — Z79899 Other long term (current) drug therapy: Secondary | ICD-10-CM | POA: Insufficient documentation

## 2014-12-30 DIAGNOSIS — F17210 Nicotine dependence, cigarettes, uncomplicated: Secondary | ICD-10-CM | POA: Insufficient documentation

## 2014-12-30 DIAGNOSIS — F17200 Nicotine dependence, unspecified, uncomplicated: Secondary | ICD-10-CM

## 2014-12-30 DIAGNOSIS — Z720 Tobacco use: Secondary | ICD-10-CM | POA: Diagnosis not present

## 2014-12-30 DIAGNOSIS — Z8249 Family history of ischemic heart disease and other diseases of the circulatory system: Secondary | ICD-10-CM | POA: Diagnosis not present

## 2014-12-30 DIAGNOSIS — Z7982 Long term (current) use of aspirin: Secondary | ICD-10-CM | POA: Diagnosis not present

## 2014-12-30 DIAGNOSIS — E785 Hyperlipidemia, unspecified: Secondary | ICD-10-CM | POA: Diagnosis not present

## 2014-12-30 DIAGNOSIS — I5023 Acute on chronic systolic (congestive) heart failure: Secondary | ICD-10-CM

## 2014-12-30 DIAGNOSIS — Z8674 Personal history of sudden cardiac arrest: Secondary | ICD-10-CM | POA: Diagnosis not present

## 2014-12-30 DIAGNOSIS — I5022 Chronic systolic (congestive) heart failure: Secondary | ICD-10-CM | POA: Diagnosis not present

## 2014-12-30 MED ORDER — CARVEDILOL 6.25 MG PO TABS
6.2500 mg | ORAL_TABLET | Freq: Two times a day (BID) | ORAL | Status: AC
Start: 2014-12-30 — End: ?

## 2014-12-30 NOTE — Telephone Encounter (Signed)
Per Dr.McLean pt is to have a RHC x 2 weeks Procedure scheduled for 01/12/15 @ 930 am Pt aware via telephone 12/29/14 and letter mailed With pts current insurance- [employer] Cpt [phone] Referral faxed for authorization

## 2014-12-30 NOTE — Patient Instructions (Signed)
Your physician has requested that you have a cardiac catheterization. Cardiac catheterization is used to diagnose and/or treat various heart conditions. Doctors may recommend this procedure for a number of different reasons. The most common reason is to evaluate chest pain. Chest pain can be a symptom of coronary artery disease (CAD), and cardiac catheterization can show whether plaque is narrowing or blocking your heart's arteries. This procedure is also used to evaluate the valves, as well as measure the blood flow and oxygen levels in different parts of your heart. For further information please visit [email or web address]. Please follow instruction sheet, as given.  Labs x 1 week  Your physician recommends that you schedule a follow-up appointment in: 4 week

## 2014-12-31 ENCOUNTER — Other Ambulatory Visit (HOSPITAL_COMMUNITY): Payer: Self-pay

## 2014-12-31 DIAGNOSIS — I5022 Chronic systolic (congestive) heart failure: Secondary | ICD-10-CM

## 2014-12-31 NOTE — Progress Notes (Signed)
Patient ID: Jimmy Lewis, male   DOB: 04/09/1956, 59 y.o.   MRN: [id number] PCP: Tomi BambergerSusan Fuller  59 yo with long history of CAD and ischemic cardiomyopathy presents for cardiology evaluation.  He actually has not seen a cardiologist since 2012.  He had anterior MI with cardiac arrest in 2004.  PCI to LAD at that time.  Subsequently, he had PCI to LCx and RCA, and most recently, PCI to ostial LAD in 6/10.  Most recent echo showed EF 25-30%.  EF was 32% by Cardiolite in 7/12.    About 4 weeks ago, he began to note exertional dyspnea with heavy activity.  He was not getting short of breath just walking around the yard.  He also noted prominent orthopnea and PND.  Around the same time, he started noticing left-sided chest tightness usually in the afternoon doing heavy activity.  No chest pain at rest or with mild exertion.  In the midst of these symptoms, he turned over his riding lawnmower and developed a distal radial fracture.    ECG (6/16): NSR, old ASMI, old lateral MI  Labs (6/16): K 3.4, creatinine 1.09, HCT 42.3, BNP 353  PMH: 1. CAD: Anterior MI with cardiac arrest in 2004 => PCI to LAD.  Subsequently, he had stents to LCx and RCA.  In 6/10, he had DES to ostial LAD.  Lexiscan Cardiolite in 7/12 showed EF 32%, apical anterior infarct with no ischemia.  2. Ischemic cardiomyopathy: Echo (7/10) with EF 40-45%.  EF 32% on Cardiolite in 7/12.  Echo (6/16) with EF 25-30%. CHF exacerbation in 6/16.  3. Active smoker, suspect COPD 4. Distal radial fracture 6/16.  5. Hyperlipidemia  [place], smokes 1/2 ppd, rare ETOH.   FH: Father with MI, uncles with MIs.  Strong CAD history on paternal side.  ROS: All systems reviewed and negative except as per HPI.   Current Outpatient Prescriptions  Medication Sig Dispense Refill  . albuterol (VENTOLIN HFA) 108 (90 BASE) MCG/ACT inhaler Inhale 2 puffs into the lungs every 6 (six) hours as needed for wheezing or shortness of  breath.     Marland Kitchen. aspirin 325 MG EC tablet Take 325 mg by mouth 2 (two) times daily.     Marland Kitchen. atorvastatin (LIPITOR) 10 MG tablet Take 10 mg by mouth at bedtime.    . carvedilol (COREG) 6.25 MG tablet Take 1 tablet (6.25 mg total) by mouth 2 (two) times daily. 60 tablet 3  . diphenhydrAMINE (BENADRYL) spray Apply 1 spray topically daily as needed for itching.    . famotidine-calcium carbonate-magnesium hydroxide (PEPCID COMPLETE) 10-800-165 MG CHEW chewable tablet Chew 1 tablet by mouth at bedtime.    Marland Kitchen. FLUoxetine (PROZAC) 40 MG capsule Take 40 mg by mouth daily.    . furosemide (LASIX) 20 MG tablet Take 1 tablet (20 mg total) by mouth daily. 30 tablet 1  . HYDROcodone-acetaminophen (NORCO) 7.5-325 MG per tablet Take 1 tablet by mouth every 6 (six) hours as needed for moderate pain. 30 tablet 0  . ibuprofen (ADVIL,MOTRIN) 200 MG tablet Take 200 mg by mouth 2 (two) times daily.    Marland Kitchen. losartan (COZAAR) 25 MG tablet Take 1 tablet (25 mg total) by mouth daily. 30 tablet 1  . Multiple Vitamins-Minerals (MULTIVITAMIN WITH MINERALS) tablet Take 1 tablet by mouth daily.      . nitroGLYCERIN (NITROSTAT) 0.4 MG SL tablet Place 0.4 mg under the tongue every 5 (five) minutes x 3 doses as  needed.    Marland Kitchen omega-3 fish oil (MAXEPA) 1000 MG CAPS capsule Take 2 g by mouth daily.    . traZODone (DESYREL) 100 MG tablet Take 100 mg by mouth at bedtime.      No current facility-administered medications for this encounter.   BP 104/68 mmHg  Pulse 56  Ht  (1.854 m)  Wt 189 lb 12.8 oz (86.093 kg)  BMI 25.05 kg/m2  SpO2 100% General: NAD Neck: No JVD, no thyromegaly or thyroid nodule.  Lungs: Somewhat distant breath sounds.  CV: Nondisplaced PMI.  Heart regular S1/S2, no S3/S4, no murmur.  Trace ankle edema.  No carotid bruit.    Abdomen: Soft, nontender, no hepatosplenomegaly, no distention.  Skin: Intact without lesions or rashes.  Neurologic: Alert and oriented x 3.  Psych: Normal affect. Extremities: No  clubbing or cyanosis.  HEENT: Normal.   Assessment/Plan: 1. CAD: Patient has extensive CAD history.  Last evaluation was Cardiolite in 7/12 which showed infarct versus attenuation with no ischemia.  Over the last 4 weeks or so, he has developed angina-like exertional chest pain.   I am concerned for development of new obstructive CAD.   - I will arrange for coronary angiography.  I discussed the risks/benefits of the procedure with the patient and he agrees to the study. - Until cath, I would like him to take it easy/avoid heavy work.  Go to ER with chest pain at rest.   - Continue ASA, statin.  2. Hyperlipidemia: Lipids/LFTs in 8/16.   3. Chronic systolic CHF: Recent CHF exacerbation. ?triggered by worsening CAD (see above regarding angina) versus LV remodeling over time.  EF 25-30% by echo.  On exam today, he is euvolemic.  BP is on the lower side.  - Continue losartan 25 mg daily. - Increase Coreg to 6.25 mg bid - I would like to add spironolactone next appt.  - Continue current Lasix.  - Will need BMET prior to cath.  - If no coronary intervention, he would qualify for ICD given lack of improvement in EF over years.  Not CRT candidate with narrow QRS.  4. Suspected COPD: I strongly urged him to quit smoking.   Marca Ancona 12/31/2014

## 2015-01-07 ENCOUNTER — Other Ambulatory Visit (HOSPITAL_COMMUNITY): Payer: Commercial Managed Care - HMO

## 2015-01-09 ENCOUNTER — Telehealth (HOSPITAL_COMMUNITY): Payer: Self-pay | Admitting: *Deleted

## 2015-01-09 NOTE — Telephone Encounter (Signed)
Cancelled pt's cath sch for 7/25, will send mess to Dr Shirlee Latch, she is aware he is on vacation this week

## 2015-01-09 NOTE — Telephone Encounter (Signed)
-----   Message from Trish Fountain sent at 01/09/2015  9:20 AM EDT ----- Pt daughter Cleotis Nipper called pt passed away on 2015/01/17 she want Dr. Shirlee Latch to call her [phone]

## 2015-01-12 ENCOUNTER — Ambulatory Visit (HOSPITAL_COMMUNITY)
Admission: RE | Admit: 2015-01-12 | Payer: Commercial Managed Care - HMO | Source: Ambulatory Visit | Admitting: Cardiology

## 2015-01-12 ENCOUNTER — Encounter (HOSPITAL_COMMUNITY): Admission: RE | Payer: Self-pay | Source: Ambulatory Visit

## 2015-01-12 SURGERY — LEFT HEART CATH AND CORONARY ANGIOGRAPHY
Anesthesia: LOCAL

## 2015-01-12 NOTE — Telephone Encounter (Signed)
I tried that number, it is non-working.  Jimmy Lewis, is [phone] correct?

## 2015-01-19 DEATH — deceased

## 2015-01-28 ENCOUNTER — Encounter (HOSPITAL_COMMUNITY): Payer: Commercial Managed Care - HMO

## 2019-06-21 HISTORY — PX: REPLACEMENT TOTAL KNEE: SUR1224

## 2019-08-29 DIAGNOSIS — M1712 Unilateral primary osteoarthritis, left knee: Secondary | ICD-10-CM | POA: Diagnosis not present

## 2019-08-29 DIAGNOSIS — M25562 Pain in left knee: Secondary | ICD-10-CM | POA: Diagnosis not present

## 2020-02-28 DIAGNOSIS — M25562 Pain in left knee: Secondary | ICD-10-CM | POA: Diagnosis not present

## 2020-02-28 DIAGNOSIS — M25662 Stiffness of left knee, not elsewhere classified: Secondary | ICD-10-CM | POA: Diagnosis not present

## 2020-03-02 DIAGNOSIS — Z0189 Encounter for other specified special examinations: Secondary | ICD-10-CM | POA: Diagnosis not present

## 2020-04-07 DIAGNOSIS — G8918 Other acute postprocedural pain: Secondary | ICD-10-CM | POA: Diagnosis not present

## 2020-04-07 DIAGNOSIS — Z96652 Presence of left artificial knee joint: Secondary | ICD-10-CM | POA: Diagnosis not present

## 2020-04-07 DIAGNOSIS — M1712 Unilateral primary osteoarthritis, left knee: Secondary | ICD-10-CM | POA: Diagnosis not present

## 2020-04-09 DIAGNOSIS — R2689 Other abnormalities of gait and mobility: Secondary | ICD-10-CM | POA: Diagnosis not present

## 2020-04-09 DIAGNOSIS — Z471 Aftercare following joint replacement surgery: Secondary | ICD-10-CM | POA: Diagnosis not present

## 2020-04-09 DIAGNOSIS — R531 Weakness: Secondary | ICD-10-CM | POA: Diagnosis not present

## 2020-04-09 DIAGNOSIS — M25562 Pain in left knee: Secondary | ICD-10-CM | POA: Diagnosis not present

## 2020-04-09 DIAGNOSIS — Z96652 Presence of left artificial knee joint: Secondary | ICD-10-CM | POA: Diagnosis not present

## 2020-04-10 DIAGNOSIS — M25562 Pain in left knee: Secondary | ICD-10-CM | POA: Diagnosis not present

## 2020-04-10 DIAGNOSIS — Z471 Aftercare following joint replacement surgery: Secondary | ICD-10-CM | POA: Diagnosis not present

## 2020-04-10 DIAGNOSIS — R2689 Other abnormalities of gait and mobility: Secondary | ICD-10-CM | POA: Diagnosis not present

## 2020-04-10 DIAGNOSIS — R531 Weakness: Secondary | ICD-10-CM | POA: Diagnosis not present

## 2020-04-10 DIAGNOSIS — Z96652 Presence of left artificial knee joint: Secondary | ICD-10-CM | POA: Diagnosis not present

## 2020-04-13 DIAGNOSIS — Z96652 Presence of left artificial knee joint: Secondary | ICD-10-CM | POA: Diagnosis not present

## 2020-04-13 DIAGNOSIS — Z471 Aftercare following joint replacement surgery: Secondary | ICD-10-CM | POA: Diagnosis not present

## 2020-04-13 DIAGNOSIS — R531 Weakness: Secondary | ICD-10-CM | POA: Diagnosis not present

## 2020-04-13 DIAGNOSIS — M25562 Pain in left knee: Secondary | ICD-10-CM | POA: Diagnosis not present

## 2020-04-13 DIAGNOSIS — R2689 Other abnormalities of gait and mobility: Secondary | ICD-10-CM | POA: Diagnosis not present

## 2020-04-15 DIAGNOSIS — Z471 Aftercare following joint replacement surgery: Secondary | ICD-10-CM | POA: Diagnosis not present

## 2020-04-15 DIAGNOSIS — M25562 Pain in left knee: Secondary | ICD-10-CM | POA: Diagnosis not present

## 2020-04-15 DIAGNOSIS — R531 Weakness: Secondary | ICD-10-CM | POA: Diagnosis not present

## 2020-04-15 DIAGNOSIS — R2689 Other abnormalities of gait and mobility: Secondary | ICD-10-CM | POA: Diagnosis not present

## 2020-04-15 DIAGNOSIS — Z96652 Presence of left artificial knee joint: Secondary | ICD-10-CM | POA: Diagnosis not present

## 2020-04-17 DIAGNOSIS — R531 Weakness: Secondary | ICD-10-CM | POA: Diagnosis not present

## 2020-04-17 DIAGNOSIS — Z471 Aftercare following joint replacement surgery: Secondary | ICD-10-CM | POA: Diagnosis not present

## 2020-04-17 DIAGNOSIS — Z96652 Presence of left artificial knee joint: Secondary | ICD-10-CM | POA: Diagnosis not present

## 2020-04-17 DIAGNOSIS — R2689 Other abnormalities of gait and mobility: Secondary | ICD-10-CM | POA: Diagnosis not present

## 2020-04-17 DIAGNOSIS — M25562 Pain in left knee: Secondary | ICD-10-CM | POA: Diagnosis not present

## 2020-04-20 DIAGNOSIS — Z96652 Presence of left artificial knee joint: Secondary | ICD-10-CM | POA: Diagnosis not present

## 2020-04-20 DIAGNOSIS — Z471 Aftercare following joint replacement surgery: Secondary | ICD-10-CM | POA: Diagnosis not present

## 2020-04-20 DIAGNOSIS — R2689 Other abnormalities of gait and mobility: Secondary | ICD-10-CM | POA: Diagnosis not present

## 2020-04-20 DIAGNOSIS — R531 Weakness: Secondary | ICD-10-CM | POA: Diagnosis not present

## 2020-04-20 DIAGNOSIS — M25562 Pain in left knee: Secondary | ICD-10-CM | POA: Diagnosis not present

## 2020-04-22 DIAGNOSIS — M25562 Pain in left knee: Secondary | ICD-10-CM | POA: Diagnosis not present

## 2020-04-22 DIAGNOSIS — Z471 Aftercare following joint replacement surgery: Secondary | ICD-10-CM | POA: Diagnosis not present

## 2020-04-22 DIAGNOSIS — R2689 Other abnormalities of gait and mobility: Secondary | ICD-10-CM | POA: Diagnosis not present

## 2020-04-22 DIAGNOSIS — R531 Weakness: Secondary | ICD-10-CM | POA: Diagnosis not present

## 2020-04-22 DIAGNOSIS — Z96652 Presence of left artificial knee joint: Secondary | ICD-10-CM | POA: Diagnosis not present

## 2020-04-24 DIAGNOSIS — R2689 Other abnormalities of gait and mobility: Secondary | ICD-10-CM | POA: Diagnosis not present

## 2020-04-24 DIAGNOSIS — M25562 Pain in left knee: Secondary | ICD-10-CM | POA: Diagnosis not present

## 2020-04-24 DIAGNOSIS — R531 Weakness: Secondary | ICD-10-CM | POA: Diagnosis not present

## 2020-04-24 DIAGNOSIS — Z471 Aftercare following joint replacement surgery: Secondary | ICD-10-CM | POA: Diagnosis not present

## 2020-04-24 DIAGNOSIS — Z96652 Presence of left artificial knee joint: Secondary | ICD-10-CM | POA: Diagnosis not present

## 2020-04-27 DIAGNOSIS — R531 Weakness: Secondary | ICD-10-CM | POA: Diagnosis not present

## 2020-04-27 DIAGNOSIS — Z471 Aftercare following joint replacement surgery: Secondary | ICD-10-CM | POA: Diagnosis not present

## 2020-04-27 DIAGNOSIS — R2689 Other abnormalities of gait and mobility: Secondary | ICD-10-CM | POA: Diagnosis not present

## 2020-04-27 DIAGNOSIS — Z96652 Presence of left artificial knee joint: Secondary | ICD-10-CM | POA: Diagnosis not present

## 2020-04-27 DIAGNOSIS — M25562 Pain in left knee: Secondary | ICD-10-CM | POA: Diagnosis not present

## 2020-04-29 DIAGNOSIS — R531 Weakness: Secondary | ICD-10-CM | POA: Diagnosis not present

## 2020-04-29 DIAGNOSIS — M25562 Pain in left knee: Secondary | ICD-10-CM | POA: Diagnosis not present

## 2020-04-29 DIAGNOSIS — Z96652 Presence of left artificial knee joint: Secondary | ICD-10-CM | POA: Diagnosis not present

## 2020-04-29 DIAGNOSIS — Z471 Aftercare following joint replacement surgery: Secondary | ICD-10-CM | POA: Diagnosis not present

## 2020-04-29 DIAGNOSIS — R2689 Other abnormalities of gait and mobility: Secondary | ICD-10-CM | POA: Diagnosis not present

## 2020-05-01 DIAGNOSIS — R531 Weakness: Secondary | ICD-10-CM | POA: Diagnosis not present

## 2020-05-01 DIAGNOSIS — R2689 Other abnormalities of gait and mobility: Secondary | ICD-10-CM | POA: Diagnosis not present

## 2020-05-01 DIAGNOSIS — M25562 Pain in left knee: Secondary | ICD-10-CM | POA: Diagnosis not present

## 2020-05-01 DIAGNOSIS — Z471 Aftercare following joint replacement surgery: Secondary | ICD-10-CM | POA: Diagnosis not present

## 2020-05-01 DIAGNOSIS — Z96652 Presence of left artificial knee joint: Secondary | ICD-10-CM | POA: Diagnosis not present

## 2020-05-04 DIAGNOSIS — M25562 Pain in left knee: Secondary | ICD-10-CM | POA: Diagnosis not present

## 2020-05-04 DIAGNOSIS — Z96652 Presence of left artificial knee joint: Secondary | ICD-10-CM | POA: Diagnosis not present

## 2020-05-04 DIAGNOSIS — Z471 Aftercare following joint replacement surgery: Secondary | ICD-10-CM | POA: Diagnosis not present

## 2020-05-04 DIAGNOSIS — R531 Weakness: Secondary | ICD-10-CM | POA: Diagnosis not present

## 2020-05-04 DIAGNOSIS — R2689 Other abnormalities of gait and mobility: Secondary | ICD-10-CM | POA: Diagnosis not present

## 2020-05-06 DIAGNOSIS — Z96652 Presence of left artificial knee joint: Secondary | ICD-10-CM | POA: Diagnosis not present

## 2020-05-06 DIAGNOSIS — Z471 Aftercare following joint replacement surgery: Secondary | ICD-10-CM | POA: Diagnosis not present

## 2020-05-06 DIAGNOSIS — M25562 Pain in left knee: Secondary | ICD-10-CM | POA: Diagnosis not present

## 2020-05-06 DIAGNOSIS — R531 Weakness: Secondary | ICD-10-CM | POA: Diagnosis not present

## 2020-05-06 DIAGNOSIS — R2689 Other abnormalities of gait and mobility: Secondary | ICD-10-CM | POA: Diagnosis not present

## 2020-05-08 DIAGNOSIS — R531 Weakness: Secondary | ICD-10-CM | POA: Diagnosis not present

## 2020-05-08 DIAGNOSIS — M25562 Pain in left knee: Secondary | ICD-10-CM | POA: Diagnosis not present

## 2020-05-08 DIAGNOSIS — Z96652 Presence of left artificial knee joint: Secondary | ICD-10-CM | POA: Diagnosis not present

## 2020-05-08 DIAGNOSIS — R2689 Other abnormalities of gait and mobility: Secondary | ICD-10-CM | POA: Diagnosis not present

## 2020-05-08 DIAGNOSIS — Z471 Aftercare following joint replacement surgery: Secondary | ICD-10-CM | POA: Diagnosis not present

## 2020-05-11 DIAGNOSIS — M25562 Pain in left knee: Secondary | ICD-10-CM | POA: Diagnosis not present

## 2020-05-11 DIAGNOSIS — R531 Weakness: Secondary | ICD-10-CM | POA: Diagnosis not present

## 2020-05-11 DIAGNOSIS — Z471 Aftercare following joint replacement surgery: Secondary | ICD-10-CM | POA: Diagnosis not present

## 2020-05-11 DIAGNOSIS — Z96652 Presence of left artificial knee joint: Secondary | ICD-10-CM | POA: Diagnosis not present

## 2020-05-11 DIAGNOSIS — R2689 Other abnormalities of gait and mobility: Secondary | ICD-10-CM | POA: Diagnosis not present

## 2020-05-12 DIAGNOSIS — Z96652 Presence of left artificial knee joint: Secondary | ICD-10-CM | POA: Diagnosis not present

## 2020-05-13 DIAGNOSIS — R531 Weakness: Secondary | ICD-10-CM | POA: Diagnosis not present

## 2020-05-13 DIAGNOSIS — R2689 Other abnormalities of gait and mobility: Secondary | ICD-10-CM | POA: Diagnosis not present

## 2020-05-13 DIAGNOSIS — Z471 Aftercare following joint replacement surgery: Secondary | ICD-10-CM | POA: Diagnosis not present

## 2020-05-13 DIAGNOSIS — Z96652 Presence of left artificial knee joint: Secondary | ICD-10-CM | POA: Diagnosis not present

## 2020-05-13 DIAGNOSIS — M25562 Pain in left knee: Secondary | ICD-10-CM | POA: Diagnosis not present

## 2020-05-15 DIAGNOSIS — R2689 Other abnormalities of gait and mobility: Secondary | ICD-10-CM | POA: Diagnosis not present

## 2020-05-15 DIAGNOSIS — R531 Weakness: Secondary | ICD-10-CM | POA: Diagnosis not present

## 2020-05-15 DIAGNOSIS — Z471 Aftercare following joint replacement surgery: Secondary | ICD-10-CM | POA: Diagnosis not present

## 2020-05-15 DIAGNOSIS — M25562 Pain in left knee: Secondary | ICD-10-CM | POA: Diagnosis not present

## 2020-05-15 DIAGNOSIS — Z96652 Presence of left artificial knee joint: Secondary | ICD-10-CM | POA: Diagnosis not present

## 2020-05-18 DIAGNOSIS — R531 Weakness: Secondary | ICD-10-CM | POA: Diagnosis not present

## 2020-05-18 DIAGNOSIS — R2689 Other abnormalities of gait and mobility: Secondary | ICD-10-CM | POA: Diagnosis not present

## 2020-05-18 DIAGNOSIS — Z96652 Presence of left artificial knee joint: Secondary | ICD-10-CM | POA: Diagnosis not present

## 2020-05-18 DIAGNOSIS — Z471 Aftercare following joint replacement surgery: Secondary | ICD-10-CM | POA: Diagnosis not present

## 2020-05-18 DIAGNOSIS — M25562 Pain in left knee: Secondary | ICD-10-CM | POA: Diagnosis not present

## 2020-05-20 DIAGNOSIS — M25562 Pain in left knee: Secondary | ICD-10-CM | POA: Diagnosis not present

## 2020-05-20 DIAGNOSIS — Z96652 Presence of left artificial knee joint: Secondary | ICD-10-CM | POA: Diagnosis not present

## 2020-05-20 DIAGNOSIS — Z471 Aftercare following joint replacement surgery: Secondary | ICD-10-CM | POA: Diagnosis not present

## 2020-05-20 DIAGNOSIS — R2689 Other abnormalities of gait and mobility: Secondary | ICD-10-CM | POA: Diagnosis not present

## 2020-05-20 DIAGNOSIS — R531 Weakness: Secondary | ICD-10-CM | POA: Diagnosis not present

## 2020-05-22 DIAGNOSIS — Z96652 Presence of left artificial knee joint: Secondary | ICD-10-CM | POA: Diagnosis not present

## 2020-05-22 DIAGNOSIS — Z471 Aftercare following joint replacement surgery: Secondary | ICD-10-CM | POA: Diagnosis not present

## 2020-05-22 DIAGNOSIS — R2689 Other abnormalities of gait and mobility: Secondary | ICD-10-CM | POA: Diagnosis not present

## 2020-05-22 DIAGNOSIS — M25562 Pain in left knee: Secondary | ICD-10-CM | POA: Diagnosis not present

## 2020-05-22 DIAGNOSIS — R531 Weakness: Secondary | ICD-10-CM | POA: Diagnosis not present

## 2020-06-03 DIAGNOSIS — E782 Mixed hyperlipidemia: Secondary | ICD-10-CM | POA: Diagnosis not present

## 2020-06-03 DIAGNOSIS — Z7689 Persons encountering health services in other specified circumstances: Secondary | ICD-10-CM | POA: Diagnosis not present

## 2020-06-03 DIAGNOSIS — Z0001 Encounter for general adult medical examination with abnormal findings: Secondary | ICD-10-CM | POA: Diagnosis not present

## 2020-06-03 DIAGNOSIS — R7303 Prediabetes: Secondary | ICD-10-CM | POA: Diagnosis not present

## 2020-06-26 ENCOUNTER — Other Ambulatory Visit: Payer: Self-pay

## 2020-06-26 ENCOUNTER — Encounter: Payer: Self-pay | Admitting: Urology

## 2020-06-26 ENCOUNTER — Ambulatory Visit (INDEPENDENT_AMBULATORY_CARE_PROVIDER_SITE_OTHER): Payer: Medicare Other | Admitting: Urology

## 2020-06-26 VITALS — BP 164/89 | HR 80 | Temp 98.2°F | Ht 74.0 in | Wt 245.0 lb

## 2020-06-26 DIAGNOSIS — R972 Elevated prostate specific antigen [PSA]: Secondary | ICD-10-CM

## 2020-06-26 LAB — MICROSCOPIC EXAMINATION: RBC, Urine: NONE SEEN /hpf (ref 0–2)

## 2020-06-26 LAB — URINALYSIS, ROUTINE W REFLEX MICROSCOPIC
Bilirubin, UA: NEGATIVE
Ketones, UA: NEGATIVE
Leukocytes,UA: NEGATIVE
Nitrite, UA: NEGATIVE
Protein,UA: NEGATIVE
RBC, UA: NEGATIVE
Specific Gravity, UA: 1.02 (ref 1.005–1.030)
Urobilinogen, Ur: 0.2 mg/dL (ref 0.2–1.0)
pH, UA: 5 (ref 5.0–7.5)

## 2020-06-26 NOTE — Progress Notes (Signed)
H&P  Chief Complaint: PSA elevation  History of Present Illness: Scott Ball was referred over for PSA elevation.  His 06/03/2020 PSA was 4.2. He doesn't recall a prior PSA. His brother has high PSA and has had several biopsy. No h/o BPH. He takes a diuretic. Occasional nocturia.   He denies any prior urologic medications or surgery. He takes a daily ASA. 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   Past Medical History:  Diagnosis Date  . Acid reflux   . Asthma   . Gout   . Hypertension      Home Medications:  (Not in a hospital admission)  Allergies:  Allergies  Allergen Reactions  . Sulfamethoxazole Anaphylaxis    No family history on file. Social History:  reports that he has never smoked. He has never used smokeless tobacco. No history on file for alcohol use and drug use.  ROS: A complete review of systems was performed.  All systems are negative except for pertinent findings as noted. ROS   Physical Exam:  Vital signs in last 24 hours: @VSRANGES @ General:  Alert and oriented, No acute distress HEENT: Normocephalic, atraumatic Neck: No JVD or lymphadenopathy Cardiovascular: Regular rate and rhythm Lungs: Regular rate and effort Abdomen: Soft, nontender, nondistended, no abdominal masses Back: No CVA tenderness Extremities: No edema Neurologic: Grossly intact GU: Penis circumcised, normal foreskin, testicles descended bilaterally and palpably normal, bilateral epididymis palpably normal, scrotum normal DRE: Prostate 30 g, smooth without hard area or nodule - difficult to palpate    Laboratory Data:  No results found for this or any previous visit (from the past 24 hour(s)). No results found for this or any previous visit (from the past 240 hour(s)). Creatinine: No results for input(s): CREATININE in the last 168 hours.  Impression/Assessment/plan:  PSA elevation - I had a long discussion with the patient on the nature of elevated PSA - benign vs malignant  causes. We discussed age specific levels and that PCa can be seen on a biopsy with low PSA levels < 3. We discussed the nature risks and benefits of continued surveillance, other lab tests, imaging as well as prostate biopsy. We discussed the management of prostate cancer might include active surveillance or treatment depending on biopsy findings. All questions answered. Will recheck PSA in 6 weeks and f/u to review.     Festus Aloe 06/26/2020, 9:44 AM

## 2020-06-26 NOTE — Patient Instructions (Signed)
Prostate Cancer Screening  Prostate cancer screening is a test that is done to check for the presence of prostate cancer in men. The prostate gland is a walnut-sized gland that is located below the bladder and in front of the rectum in males. The function of the prostate is to add fluid to semen during ejaculation. Prostate cancer is the second most common type of cancer in men. Who should have prostate cancer screening?  Screening recommendations vary based on age and other risk factors. Screening is recommended if:  You are older than age 55. If you are age 55-69, talk with your health care provider about your need for screening and how often screening should be done. Because most prostate cancers are slow growing and will not cause death, screening is generally reserved in this age group for men who have a 10-15-year life expectancy.  You are younger than age 55, and you have these risk factors: ? Being a black male or a male of African descent. ? Having a father, brother, or uncle who has been diagnosed with prostate cancer. The risk is higher if your family member's cancer occurred at an early age. Screening is not recommended if:  You are younger than age 40.  You are between the ages of 40 and 54 and you have no risk factors.  You are 67 years of age or older. At this age, the risks that screening can cause are greater than the benefits that it may provide. If you are at high risk for prostate cancer, your health care provider may recommend that you have screenings more often or that you start screening at a younger age. How is screening for prostate cancer done? The recommended prostate cancer screening test is a blood test called the prostate-specific antigen (PSA) test. PSA is a protein that is made in the prostate. As you age, your prostate naturally produces more PSA. Abnormally high PSA levels may be caused by:  Prostate cancer.  An enlarged prostate that is not caused by cancer  (benign prostatic hyperplasia, BPH). This condition is very common in older men.  A prostate gland infection (prostatitis). Depending on the PSA results, you may need more tests, such as:  A physical exam to check the size of your prostate gland.  Blood and imaging tests.  A procedure to remove tissue samples from your prostate gland for testing (biopsy). What are the benefits of prostate cancer screening?  Screening can help to identify cancer at an early stage, before symptoms start and when the cancer can be treated more easily.  There is a small chance that screening may lower your risk of dying from prostate cancer. The chance is small because prostate cancer is a slow-growing cancer, and most men with prostate cancer die from a different cause. What are the risks of prostate cancer screening? The main risk of prostate cancer screening is diagnosing and treating prostate cancer that would never have caused any symptoms or problems. This is called overdiagnosisand overtreatment. PSA screening cannot tell you if your PSA is high due to cancer or a different cause. A prostate biopsy is the only procedure to diagnose prostate cancer. Even the results of a biopsy may not tell you if your cancer needs to be treated. Slow-growing prostate cancer may not need any treatment other than monitoring, so diagnosing and treating it may cause unnecessary stress or other side effects. A prostate biopsy may also cause:  Infection or fever.  A false negative. This is   a result that shows that you do not have prostate cancer when you actually do have prostate cancer. Questions to ask your health care provider  When should I start prostate cancer screening?  What is my risk for prostate cancer?  How often do I need screening?  What type of screening tests do I need?  How do I get my test results?  What do my results mean?  Do I need treatment? Where to find more information  The American Cancer  Society: www.cancer.org  American Urological Association: www.auanet.org Contact a health care provider if:  You have difficulty urinating.  You have pain when you urinate or ejaculate.  You have blood in your urine or semen.  You have pain in your back or in the area of your prostate. Summary  Prostate cancer is a common type of cancer in men. The prostate gland is located below the bladder and in front of the rectum. This gland adds fluid to semen during ejaculation.  Prostate cancer screening may identify cancer at an early stage, when the cancer can be treated more easily.  The prostate-specific antigen (PSA) test is the recommended screening test for prostate cancer.  Discuss the risks and benefits of prostate cancer screening with your health care provider. If you are age 67 or older, the risks that screening can cause are greater than the benefits that it may provide. This information is not intended to replace advice given to you by your health care provider. Make sure you discuss any questions you have with your health care provider. Document Revised: 01/17/2019 Document Reviewed: 01/17/2019 Elsevier Patient Education  2020 Elsevier Inc.  

## 2020-06-26 NOTE — Progress Notes (Signed)

## 2020-08-17 ENCOUNTER — Other Ambulatory Visit: Payer: Self-pay

## 2020-08-17 ENCOUNTER — Other Ambulatory Visit: Payer: Medicare Other

## 2020-08-17 DIAGNOSIS — R972 Elevated prostate specific antigen [PSA]: Secondary | ICD-10-CM

## 2020-08-18 LAB — PSA: Prostate Specific Ag, Serum: 4.3 ng/mL — ABNORMAL HIGH (ref 0.0–4.0)

## 2020-08-24 ENCOUNTER — Other Ambulatory Visit: Payer: Self-pay

## 2020-08-24 ENCOUNTER — Ambulatory Visit (INDEPENDENT_AMBULATORY_CARE_PROVIDER_SITE_OTHER): Payer: Medicare Other | Admitting: Urology

## 2020-08-24 VITALS — BP 132/72 | HR 78 | Temp 98.7°F | Ht 74.0 in | Wt 240.0 lb

## 2020-08-24 DIAGNOSIS — N401 Enlarged prostate with lower urinary tract symptoms: Secondary | ICD-10-CM

## 2020-08-24 DIAGNOSIS — N138 Other obstructive and reflux uropathy: Secondary | ICD-10-CM | POA: Diagnosis not present

## 2020-08-24 DIAGNOSIS — R972 Elevated prostate specific antigen [PSA]: Secondary | ICD-10-CM | POA: Diagnosis not present

## 2020-08-24 DIAGNOSIS — R3913 Splitting of urinary stream: Secondary | ICD-10-CM | POA: Diagnosis not present

## 2020-08-24 LAB — MICROSCOPIC EXAMINATION
Bacteria, UA: NONE SEEN
Epithelial Cells (non renal): NONE SEEN /hpf (ref 0–10)
Renal Epithel, UA: NONE SEEN /hpf
WBC, UA: NONE SEEN /hpf (ref 0–5)

## 2020-08-24 LAB — URINALYSIS, ROUTINE W REFLEX MICROSCOPIC
Bilirubin, UA: NEGATIVE
Ketones, UA: NEGATIVE
Leukocytes,UA: NEGATIVE
Nitrite, UA: NEGATIVE
Protein,UA: NEGATIVE
Specific Gravity, UA: 1.025 (ref 1.005–1.030)
Urobilinogen, Ur: 0.2 mg/dL (ref 0.2–1.0)
pH, UA: 5.5 (ref 5.0–7.5)

## 2020-08-24 MED ORDER — LEVOFLOXACIN 750 MG PO TABS: 750.0000 mg | ORAL_TABLET | Freq: Every day | ORAL | 0 refills | Status: AC

## 2020-08-24 NOTE — Progress Notes (Signed)
Urological Symptom Review  Patient is experiencing the following symptoms: Get up at night to urinate Stream starts and stops   Review of Systems  Gastrointestinal (upper)  : Negative for upper GI symptoms  Gastrointestinal (lower) : Negative for lower GI symptoms  Constitutional : Negative for symptoms  Skin: Negative for skin symptoms  Eyes: Negative for eye symptoms  Ear/Nose/Throat : Negative for Ear/Nose/Throat symptoms  Hematologic/Lymphatic: Negative for Hematologic/Lymphatic symptoms  Cardiovascular : Negative for cardiovascular symptoms  Respiratory : Negative for respiratory symptoms  Endocrine: Negative for endocrine symptoms  Musculoskeletal: Back pain  Neurological: Negative for neurological symptoms  Psychologic: Negative for psychiatric symptoms  

## 2020-08-24 NOTE — Patient Instructions (Addendum)
   Appointment Time: arrive 12:15 pm Appointment Date: March 21st, 2022  Location: [facility] Radiology Department   Prostate Biopsy Instructions  Stop all aspirin or blood thinners (aspirin, plavix, coumadin, warfarin, motrin, ibuprofen, advil, aleve, naproxen, naprosyn) for 7 days prior to the procedure.  If you have any questions about stopping these medications, please contact your primary care physician or cardiologist.  Having a light meal prior to the procedure is recommended.  If you are diabetic or have low blood sugar please bring a small snack or glucose tablet.  A Fleets enema is needed to be purchased over the counter at a local pharmacy and used 2 hours before you scheduled appointment.  This can be purchased over the counter at any pharmacy.  Antibiotics will be administered in the clinic at the time of the procedure and 1 tablet has been sent to your pharmacy. Please take the antibiotic as prescribed.    Please bring someone with you to the procedure to drive you home if you are given a valium to take prior to your procedure.   If you have any questions or concerns, please feel free to call the office at [phone] or send a Mychart message.    Thank you, [facility]

## 2020-08-24 NOTE — Progress Notes (Signed)
08/24/2020 8:46 AM   Marlana Salvage [birth year] [id number]  Referring provider: Bonnita Hollow, MD [place],  [place] [address]  CC: PSA elevation   HPI: F/u -   1) PSA elevation.  His 06/03/2020 PSA was 4.2. He doesn't recall a prior PSA. His brother has high PSA and has had several biopsies. No h/o BPH but has LUTS for several years. He takes a diuretic. Occasional nocturia (1-2), flow starts and stops. AUASS = 10, mostly satisfied.   Repeat 02/22 PSA was 4.3.   He denies any prior urologic medications or surgery. He takes a daily ASA. 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   PMH: Past Medical History:  Diagnosis Date  . Acid reflux   . Asthma   . Gout   . Hypertension     Surgical History: Reviewed   Home Medications:  Allergies as of 08/24/2020      Reactions   Sulfamethoxazole Anaphylaxis      Medication List       Accurate as of August 24, 2020  8:46 AM. If you have any questions, ask your nurse or doctor.        allopurinol 300 MG tablet Commonly known as: ZYLOPRIM Take 300 mg by mouth daily.   aspirin 81 MG EC tablet Take by mouth.   chlorthalidone 25 MG tablet Commonly known as: HYGROTON Take 25 mg by mouth daily.   diltiazem 240 MG 24 hr capsule Commonly known as: CARDIZEM CD Take 240 mg by mouth daily.   gabapentin 300 MG capsule Commonly known as: NEURONTIN   losartan 100 MG tablet Commonly known as: COZAAR Take 100 mg by mouth daily.   metFORMIN 500 MG 24 hr tablet Commonly known as: GLUCOPHAGE-XR Take 500 mg by mouth daily.   metoprolol succinate 100 MG 24 hr tablet Commonly known as: TOPROL-XL Take 100 mg by mouth daily.   oxyCODONE 5 MG immediate release tablet Commonly known as: Oxy IR/ROXICODONE Take by mouth.   rosuvastatin 5 MG tablet Commonly known as: CRESTOR Take by mouth.   traMADol 50 MG tablet Commonly known as: ULTRAM Take 50 mg by mouth every 6 (six) hours as needed.       Allergies:   Allergies  Allergen Reactions  . Sulfamethoxazole Anaphylaxis    Family History: No family history on file.  Social History:  reports that he has never smoked. He has never used smokeless tobacco. No history on file for alcohol use and drug use.   Physical Exam: BP 132/72   Pulse 78   Temp 98.7 F (37.1 C)   Ht 6\' 2"  (1.88 m)   Wt 240 lb (108.9 kg)   BMI 30.81 kg/m   Constitutional:  Alert and oriented, No acute distress. HEENT: [place] AT, moist mucus membranes.  Trachea midline, no masses. Cardiovascular: No clubbing, cyanosis, or edema. Respiratory: Normal respiratory effort, no increased work of breathing. GI: Abdomen is soft, nontender, nondistended, no abdominal masses GU: No CVA tenderness Skin: No rashes, bruises or suspicious lesions. Neurologic: Grossly intact, no focal deficits, moving all 4 extremities. Psychiatric: Normal mood and affect.  Laboratory Data: No results found for: WBC, HGB, HCT, MCV, PLT  No results found for: CREATININE  No results found for: PSA  No results found for: TESTOSTERONE  No results found for: HGBA1C  Urinalysis    Component Value Date/Time   APPEARANCEUR Clear 06/26/2020 0936   GLUCOSEU 3+ (A) 06/26/2020 [place]  Negative 06/26/2020 0936   PROTEINUR Negative 06/26/2020 0936   NITRITE Negative 06/26/2020 0936   LEUKOCYTESUR Negative 06/26/2020 0936    Lab Results  Component Value Date   LABMICR See below: 06/26/2020   WBCUA 0-5 06/26/2020   LABEPIT 0-10 06/26/2020   MUCUS Present (A) 06/26/2020   BACTERIA Few (A) 06/26/2020    Pertinent Imaging: n/a No results found for this or any previous visit.  No results found for this or any previous visit.  No results found for this or any previous visit.  No results found for this or any previous visit.  No results found for this or any previous visit.  No results found for this or any previous visit.  No results found for this or any previous visit.  No  results found for this or any previous visit.   Assessment & Plan:    1. Elevated PSA We went over again the nature of PSA elevation - benign vs malignant.  We went over some of the prostate cancer prevention trial data which showed about a 75% negative biopsy rate, 25% positive rate with about 7% high-grade disease.  We went over the nature risk benefits and alternatives to prostate biopsy and he will proceed with prostate biopsy.  We went over again the management of prostate cancer might include active surveillance versus treatment depending on staging grade. He will proceed with prostate biopsy.   2. BPH - disc surveillance, meds and procedures. He will continue surveillance.    No follow-ups on file.  Festus Aloe, MD

## 2020-08-25 ENCOUNTER — Other Ambulatory Visit: Payer: Self-pay

## 2020-08-25 DIAGNOSIS — R972 Elevated prostate specific antigen [PSA]: Secondary | ICD-10-CM

## 2020-09-07 ENCOUNTER — Other Ambulatory Visit: Payer: Self-pay | Admitting: Urology

## 2020-09-07 ENCOUNTER — Ambulatory Visit (INDEPENDENT_AMBULATORY_CARE_PROVIDER_SITE_OTHER): Payer: Medicare Other | Admitting: Urology

## 2020-09-07 ENCOUNTER — Other Ambulatory Visit: Payer: Self-pay

## 2020-09-07 ENCOUNTER — Encounter: Payer: Self-pay | Admitting: Urology

## 2020-09-07 ENCOUNTER — Encounter (HOSPITAL_COMMUNITY): Payer: Self-pay

## 2020-09-07 ENCOUNTER — Ambulatory Visit (HOSPITAL_COMMUNITY)
Admission: RE | Admit: 2020-09-07 | Discharge: 2020-09-07 | Disposition: A | Discharge status: Home / Self Care | Payer: Medicare Other | Source: Ambulatory Visit | Attending: Urology | Admitting: Urology

## 2020-09-07 DIAGNOSIS — R972 Elevated prostate specific antigen [PSA]: Secondary | ICD-10-CM | POA: Insufficient documentation

## 2020-09-07 DIAGNOSIS — C61 Malignant neoplasm of prostate: Secondary | ICD-10-CM | POA: Insufficient documentation

## 2020-09-07 MED ORDER — LIDOCAINE HCL (PF) 2 % IJ SOLN
INTRAMUSCULAR | Status: AC
  Administered 2020-09-07: 10 mL
  Filled 2020-09-07: qty 10

## 2020-09-07 MED ORDER — GENTAMICIN SULFATE 40 MG/ML IJ SOLN: 160.0000 mg | Freq: Once | INTRAMUSCULAR | Status: AC

## 2020-09-07 MED ORDER — GENTAMICIN SULFATE 40 MG/ML IJ SOLN
INTRAMUSCULAR | Status: AC
  Administered 2020-09-07: 160 mg via INTRAMUSCULAR
  Filled 2020-09-07: qty 4

## 2020-09-07 MED ORDER — LIDOCAINE HCL (PF) 2 % IJ SOLN: 10.0000 mL | Freq: Once | INTRAMUSCULAR | Status: AC

## 2020-09-07 NOTE — Sedation Documentation (Signed)
PT tolerated prostate biopsy procedure and IM injections of antibiotic well today. Labs obtained and sent for pathology. PT ambulatory at discharge with no acute distress noted and verbalized understanding of discharge instructions. PT to follow up with urologist as scheduled on 09/21/20 and reminded of upcoming appointment.

## 2020-09-07 NOTE — Progress Notes (Signed)
Prostate Biopsy Procedure   F/u for prostate bx. No dysuria or fever.   1) PSA elevation. His 06/03/2020 PSA was 4.2. He doesn't recall a prior PSA. His brother has high PSA and has had several biopsies. Repeat 02/22 PSA was 4.3.   2) BPH - LUTS for since 2019. No meds or surgery. He takes a diuretic.Occasional nocturia (1-2), flow starts and stops. AUASS = 10, mostly satisfied.   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   Informed consent was obtained after discussing risks/benefits of the procedure.  A time out was performed to ensure correct patient identity.  Pre-Procedure: - Last PSA Level: 4.3 - Gentamicin given prophylactically -DRE - benign exam  -Transrectal Ultrasound performed revealing a 78 gm prostate -No significant hypoechoic or median lobe noted  Procedure: - Prostate block performed using 10 cc 1% lidocaine and biopsies taken from sextant areas, a total of 12 under ultrasound guidance.  Post-Procedure: - Patient tolerated the procedure well - He was counseled to seek immediate medical attention if experiences any severe pain, significant bleeding, or fevers - Return in one - two week to discuss biopsy results

## 2020-09-07 NOTE — Discharge Instructions (Signed)

## 2020-09-07 NOTE — Patient Instructions (Signed)

## 2020-09-15 ENCOUNTER — Telehealth: Payer: Self-pay | Admitting: Urology

## 2020-09-15 NOTE — Telephone Encounter (Signed)
I spoke to Scott Ball this afternoon. He is doing well after the prostate bx. I discussed the positive area on the biopsy and his stage grade and prognosis. We discussed treatment vs active surveillance (AS). Given his normal PSAD and only one core with 5% Gleason 3+3=6, we will probably end up with AS. He will f/u Monday as planned to review everything in more detail.

## 2020-09-21 ENCOUNTER — Ambulatory Visit (INDEPENDENT_AMBULATORY_CARE_PROVIDER_SITE_OTHER): Payer: Medicare Other | Admitting: Urology

## 2020-09-21 ENCOUNTER — Encounter: Payer: Self-pay | Admitting: Urology

## 2020-09-21 ENCOUNTER — Ambulatory Visit: Payer: Medicare Other | Admitting: Urology

## 2020-09-21 ENCOUNTER — Other Ambulatory Visit: Payer: Self-pay

## 2020-09-21 VITALS — BP 146/79 | HR 76 | Temp 98.3°F | Ht 74.0 in | Wt 256.0 lb

## 2020-09-21 DIAGNOSIS — C61 Malignant neoplasm of prostate: Secondary | ICD-10-CM

## 2020-09-21 NOTE — Progress Notes (Signed)
Urological Symptom Review  Patient is experiencing the following symptoms: Get up at night to urinate   Review of Systems  Gastrointestinal (upper)  : Negative for upper GI symptoms  Gastrointestinal (lower) : Negative for lower GI symptoms  Constitutional : Negative for symptoms  Skin: Negative for skin symptoms  Eyes: Negative for eye symptoms  Ear/Nose/Throat : Negative for Ear/Nose/Throat symptoms  Hematologic/Lymphatic: Negative for Hematologic/Lymphatic symptoms  Cardiovascular : Negative  Respiratory : Negative for respiratory symptoms  Endocrine: Negative for endocrine symptoms  Musculoskeletal: Negative for musculoskeletal symptoms  Neurological: Negative for neurological symptoms  Psychologic: Negative for psychiatric symptoms

## 2020-09-21 NOTE — Patient Instructions (Signed)
Prostate Cancer  The prostate is a male gland that helps make semen. It is located below a man's bladder, in front of the rectum. Prostate cancer is when abnormal cells grow in this gland. What are the causes? The cause of this condition is not known. What increases the risk? You are more likely to develop this condition if:  You are 67 years of age or older.  You are African American.  You have a family history of prostate cancer.  You have a family history of breast cancer. What are the signs or symptoms? Symptoms of this condition include:  A need to pee often.  Peeing that is weak, or pee that stops and starts.  Trouble starting or stopping your pee.  Inability to pee.  Blood in your pee or semen.  Pain in the lower back, lower belly (abdomen), hips, or upper thighs.  Trouble getting an erection.  Trouble emptying all of your pee. How is this treated? Treatment for this condition depends on your age, your health, the kind of treatment you like, and how far the cancer has spread. Treatments include:  Being watched. This is called observation. You will be tested from time to time, but you will not get treated. Tests are to make sure that the cancer is not growing.  Surgery. This may be done to remove the prostate, to remove the testicles, or to freeze or kill cancer cells.  Radiation. This uses a strong beam to kill cancer cells.  Ultrasound energy. This uses strong sound waves to kill cancer cells.  Chemotherapy. This uses medicines that stop cancer cells from increasing. This kills cancer cells and healthy cells.  Targeted therapy. This kills cancer cells only. Healthy cells are not affected.  Hormone treatment. This stops the body from making hormones that help the cancer cells to grow. Follow these instructions at home:  Take over-the-counter and prescription medicines only as told by your doctor.  Eat a healthy diet.  Get plenty of sleep.  Ask your  doctor for help to find a support group for men with prostate cancer.  If you have to go to the hospital, let your cancer doctor (oncologist) know.  Treatment may affect your ability to have sex. Touch, hold, hug, and caress your partner to have intimate moments.  Keep all follow-up visits as told by your doctor. This is important. Contact a doctor if:  You have new or more trouble peeing.  You have new or more blood in your pee.  You have new or more pain in your hips, back, or chest. Get help right away if:  You have weakness in your legs.  You lose feeling in your legs.  You cannot control your pee or your poop (stool).  You have chills or a fever. Summary  The prostate is a male gland that helps make semen.  Prostate cancer is when abnormal cells grow in this gland.  Treatment includes doing surgery, using medicines, using very strong beams, or watching without treatment.  Ask your doctor for help to find a support group for men with prostate cancer.  Contact a doctor if you have problems peeing or have any new pain that you did not have before. This information is not intended to replace advice given to you by your health care provider. Make sure you discuss any questions you have with your health care provider. Document Revised: 05/21/2019 Document Reviewed: 05/21/2019 Elsevier Patient Education  2021 Elsevier Inc.  

## 2020-09-21 NOTE — Progress Notes (Signed)
09/21/2020 3:50 PM   Marlana Salvage [birth year] [id number]  Referring provider: Bonnita Hollow, MD [place],  [place] [address]  No chief complaint on file.   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 1/12 cores.    His 06/03/2020 PSA was 4.2. His brother has high PSA and has had several biopsies. Repeat 02/22 PSA was 4.3.  2) BPH - LUTS for since 2019. Prostate about 80 g on [place]. No meds or surgery. He takes a diuretic.Occasional nocturia(1-2), flow starts and stops. AUASS = 10, mostly satisfied.   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   PMH: Past Medical History:  Diagnosis Date  . Acid reflux   . Asthma   . Gout   . Hypertension     Surgical History: Past Surgical History:  Procedure Laterality Date  . BACK SURGERY  2015  . REPLACEMENT TOTAL KNEE  2021    Home Medications:  Allergies as of 09/21/2020      Reactions   Succinylsulphathiazole Itching   Sulfamethoxazole Anaphylaxis      Medication List       Accurate as of September 21, 2020  3:50 PM. If you have any questions, ask your nurse or doctor.        allopurinol 300 MG tablet Commonly known as: ZYLOPRIM Take 300 mg by mouth daily.   aspirin 81 MG EC tablet Take by mouth.   chlorthalidone 25 MG tablet Commonly known as: HYGROTON Take 25 mg by mouth daily.   diltiazem 240 MG 24 hr capsule Commonly known as: CARDIZEM CD Take 240 mg by mouth daily.   gabapentin 300 MG capsule Commonly known as: NEURONTIN   levofloxacin 750 MG tablet Commonly known as: Levaquin Take 1 tablet (750 mg total) by mouth daily. Take 2 hours prior to your procedure   losartan 100 MG tablet Commonly known as: COZAAR Take 100 mg by mouth daily.   metFORMIN 500 MG 24 hr tablet Commonly known as: GLUCOPHAGE-XR Take 500 mg by mouth daily.   metoprolol succinate 100 MG 24 hr  tablet Commonly known as: TOPROL-XL Take 100 mg by mouth daily.   oxyCODONE 5 MG immediate release tablet Commonly known as: Oxy IR/ROXICODONE Take by mouth.   rosuvastatin 5 MG tablet Commonly known as: CRESTOR Take by mouth.   traMADol 50 MG tablet Commonly known as: ULTRAM Take 50 mg by mouth every 6 (six) hours as needed.       Allergies:  Allergies  Allergen Reactions  . Succinylsulphathiazole Itching  . Sulfamethoxazole Anaphylaxis    Family History: No family history on file.  Social History:  reports that he has never smoked. He has never used smokeless tobacco. He reports that he does not drink alcohol and does not use drugs.   Physical Exam: BP (!) 146/79   Pulse 76   Temp 98.3 F (36.8 C) (Oral)   Ht 6\' 2"  (1.88 m)   Wt 256 lb (116.1 kg)   BMI 32.87 kg/m   Constitutional:  Alert and oriented, No acute distress. HEENT: [place] AT, moist mucus membranes.  Trachea midline, no masses. Cardiovascular: No clubbing, cyanosis, or edema. Respiratory: Normal respiratory effort, no increased work of breathing. GI: Abdomen is soft, nontender, nondistended, no abdominal masses GU: No CVA tenderness Skin: No rashes, bruises or suspicious  lesions. Neurologic: Grossly intact, no focal deficits, moving all 4 extremities. Psychiatric: Normal mood and affect.  Laboratory Data: No results found for: WBC, HGB, HCT, MCV, PLT  No results found for: CREATININE  No results found for: PSA  No results found for: TESTOSTERONE  No results found for: HGBA1C  Urinalysis    Component Value Date/Time   APPEARANCEUR Clear 08/24/2020 0859   GLUCOSEU 2+ (A) 08/24/2020 0859   BILIRUBINUR Negative 08/24/2020 0859   PROTEINUR Negative 08/24/2020 0859   NITRITE Negative 08/24/2020 0859   LEUKOCYTESUR Negative 08/24/2020 0859    Lab Results  Component Value Date   LABMICR See below: 08/24/2020   WBCUA None seen 08/24/2020   LABEPIT None seen 08/24/2020   MUCUS Present (A)  06/26/2020   BACTERIA None seen 08/24/2020    Pertinent Imaging: n/a No results found for this or any previous visit.  No results found for this or any previous visit.  No results found for this or any previous visit.  No results found for this or any previous visit.  No results found for this or any previous visit.  No results found for this or any previous visit.  No results found for this or any previous visit.  No results found for this or any previous visit.   Assessment & Plan:    PCa- I had a long discussion with the patient using the Living with Prostate Cancer booklet. We went his stage, grade and prognosis and the relevant anatomy. We discussed the nature risks and benefits of active surveillance, radical prostatectomy, IMRT (+/- brachytherapy, +/- ADT). We discussed specifically how each treatment might affect the bowel, bladder and sexual function. We discussed how each treatment might effect salvage treatments. We discussed the role of other modalities in the treatment of prostate cancer including chemotherapy, HIFU and cryotherapy. All questions answered. We will benign active surveillance.    No follow-ups on file.  Festus Aloe, MD  [facility] [address] [place], [place] [address] [phone]

## 2021-03-15 ENCOUNTER — Other Ambulatory Visit: Payer: Medicare Other

## 2021-03-22 ENCOUNTER — Ambulatory Visit: Payer: Medicare Other | Admitting: Urology

## 2021-04-01 ENCOUNTER — Other Ambulatory Visit: Payer: Medicare Other

## 2021-04-01 ENCOUNTER — Other Ambulatory Visit: Payer: Self-pay

## 2021-04-01 DIAGNOSIS — C61 Malignant neoplasm of prostate: Secondary | ICD-10-CM

## 2021-04-02 LAB — PSA: Prostate Specific Ag, Serum: 5.9 ng/mL — ABNORMAL HIGH (ref 0.0–4.0)

## 2021-04-05 ENCOUNTER — Ambulatory Visit: Payer: Medicare Other | Admitting: Urology

## 2021-04-05 ENCOUNTER — Encounter: Payer: Self-pay | Admitting: Urology

## 2021-04-05 ENCOUNTER — Other Ambulatory Visit: Payer: Self-pay

## 2021-04-05 VITALS — BP 147/71 | HR 67 | Temp 98.2°F | Wt 249.4 lb

## 2021-04-05 DIAGNOSIS — N401 Enlarged prostate with lower urinary tract symptoms: Secondary | ICD-10-CM

## 2021-04-05 DIAGNOSIS — R3913 Splitting of urinary stream: Secondary | ICD-10-CM | POA: Diagnosis not present

## 2021-04-05 DIAGNOSIS — N138 Other obstructive and reflux uropathy: Secondary | ICD-10-CM

## 2021-04-05 DIAGNOSIS — C61 Malignant neoplasm of prostate: Secondary | ICD-10-CM | POA: Diagnosis not present

## 2021-04-05 DIAGNOSIS — R972 Elevated prostate specific antigen [PSA]: Secondary | ICD-10-CM

## 2021-04-05 LAB — URINALYSIS, ROUTINE W REFLEX MICROSCOPIC
Bilirubin, UA: NEGATIVE
Glucose, UA: NEGATIVE
Ketones, UA: NEGATIVE
Leukocytes,UA: NEGATIVE
Nitrite, UA: NEGATIVE
Protein,UA: NEGATIVE
RBC, UA: NEGATIVE
Specific Gravity, UA: 1.025 (ref 1.005–1.030)
Urobilinogen, Ur: 0.2 mg/dL (ref 0.2–1.0)
pH, UA: 5 (ref 5.0–7.5)

## 2021-04-05 NOTE — Progress Notes (Signed)
04/05/2021 3:55 PM   Scott Ball 17-Apr-1956 [id number]  Referring provider: Bonnita Hollow, MD [place],  [place] [address]  No chief complaint on file.   HPI:  F/u -    1) PCa - pt diagnosed with low risk PCa 03/22 with PSA 4.3, T1c, Prostate 78 grams, Gleason 3+3=6 in one core, 5%. 1/12 cores.     His 06/03/2020 PSA was 4.2. His brother has high PSA and has had several biopsies. Repeat 02/22 PSA was 4.3. His 10/22 PSA was 5.9.     2) BPH - LUTS for since 2019. Prostate about 80 g on [place]. No meds or surgery. He takes a diuretic. Occasional nocturia (1-2), flow starts and stops. AUASS = 10, mostly satisfied.     He takes a daily ASA. 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    PMH: Past Medical History:  Diagnosis Date   Acid reflux    Asthma    Gout    Hypertension     Surgical History: Past Surgical History:  Procedure Laterality Date   BACK SURGERY  2015   REPLACEMENT TOTAL KNEE  2021    Home Medications:  Allergies as of 04/05/2021       Reactions   Succinylsulphathiazole Itching   Sulfamethoxazole Anaphylaxis        Medication List        Accurate as of April 05, 2021  3:55 PM. If you have any questions, ask your nurse or doctor.          allopurinol 300 MG tablet Commonly known as: ZYLOPRIM Take 300 mg by mouth daily.   aspirin 81 MG EC tablet Take by mouth.   chlorthalidone 25 MG tablet Commonly known as: HYGROTON Take 25 mg by mouth daily.   diltiazem 240 MG 24 hr capsule Commonly known as: CARDIZEM CD Take 240 mg by mouth daily.   gabapentin 300 MG capsule Commonly known as: NEURONTIN   levofloxacin 750 MG tablet Commonly known as: Levaquin Take 1 tablet (750 mg total) by mouth daily. Take 2 hours prior to your procedure   losartan 100 MG tablet Commonly known as: COZAAR Take 100 mg by mouth daily.   metFORMIN 500 MG 24 hr tablet Commonly known as: GLUCOPHAGE-XR Take 500 mg by mouth daily.    metoprolol succinate 100 MG 24 hr tablet Commonly known as: TOPROL-XL Take 100 mg by mouth daily.   oxyCODONE 5 MG immediate release tablet Commonly known as: Oxy IR/ROXICODONE Take by mouth.   rosuvastatin 5 MG tablet Commonly known as: CRESTOR Take by mouth.   traMADol 50 MG tablet Commonly known as: ULTRAM Take 50 mg by mouth every 6 (six) hours as needed.        Allergies:  Allergies  Allergen Reactions   Succinylsulphathiazole Itching   Sulfamethoxazole Anaphylaxis    Family History: No family history on file.  Social History:  reports that he has never smoked. He has never used smokeless tobacco. He reports that he does not drink alcohol and does not use drugs.   Physical Exam: BP (!) 147/71   Pulse 67   Temp 98.2 F (36.8 C)   Wt 249 lb 6.4 oz (113.1 kg)   BMI 32.02 kg/m   Constitutional:  Alert and oriented, No acute distress. HEENT: [place] AT, moist mucus membranes.  Trachea midline, no masses. Cardiovascular: No clubbing, cyanosis, or edema. Respiratory: Normal respiratory effort, no increased work of breathing. GI: Abdomen  is soft, nontender, nondistended, no abdominal masses GU: No CVA tenderness Skin: No rashes, bruises or suspicious lesions. Neurologic: Grossly intact, no focal deficits, moving all 4 extremities. Psychiatric: Normal mood and affect.  Laboratory Data: No results found for: WBC, HGB, HCT, MCV, PLT  No results found for: CREATININE  No results found for: PSA  No results found for: TESTOSTERONE  No results found for: HGBA1C  Urinalysis    Component Value Date/Time   APPEARANCEUR Clear 08/24/2020 0859   GLUCOSEU 2+ (A) 08/24/2020 0859   BILIRUBINUR Negative 08/24/2020 0859   PROTEINUR Negative 08/24/2020 0859   NITRITE Negative 08/24/2020 0859   LEUKOCYTESUR Negative 08/24/2020 0859    Lab Results  Component Value Date   LABMICR See below: 08/24/2020   WBCUA None seen 08/24/2020   LABEPIT None seen 08/24/2020    MUCUS Present (A) 06/26/2020   BACTERIA None seen 08/24/2020       Assessment & Plan:    1. Intermittent urinary stream Stable.  - Urinalysis, Routine w reflex microscopic  2. BPH with obstruction/lower urinary tract symptoms Stable - Urinalysis, Routine w reflex microscopic  3. PCa - overall PSAD remains normal. Check MRI. See back in 6 mo.    No follow-ups on file.  Festus Aloe, MD  [facility]  [address] [place], [place] [address] [phone]

## 2021-04-05 NOTE — Progress Notes (Signed)
Urological Symptom Review  Patient is experiencing the following symptoms: Hard to postpone urination Get up at night to urinate Stream starts and stops Weak stream Erection problems (male only)   Review of Systems  Gastrointestinal (upper)  : Negative for upper GI symptoms  Gastrointestinal (lower) : Negative for lower GI symptoms  Constitutional : Negative for symptoms  Skin: Negative for skin symptoms  Eyes: Negative for eye symptoms  Ear/Nose/Throat : Negative for Ear/Nose/Throat symptoms  Hematologic/Lymphatic: Negative for Hematologic/Lymphatic symptoms  Cardiovascular : Negative for cardiovascular symptoms  Respiratory : Negative for respiratory symptoms  Endocrine: Negative for endocrine symptoms  Musculoskeletal: Back pain  Neurological: Negative for neurological symptoms  Psychologic: Negative for psychiatric symptoms

## 2021-04-24 ENCOUNTER — Ambulatory Visit
Admission: RE | Admit: 2021-04-24 | Discharge: 2021-04-24 | Disposition: A | Discharge status: Home / Self Care | Payer: Medicare Other | Source: Ambulatory Visit | Attending: Urology | Admitting: Urology

## 2021-04-24 ENCOUNTER — Other Ambulatory Visit: Payer: Self-pay

## 2021-04-24 DIAGNOSIS — C61 Malignant neoplasm of prostate: Secondary | ICD-10-CM

## 2021-04-24 MED ORDER — GADOBENATE DIMEGLUMINE 529 MG/ML IV SOLN
20.0000 mL | Freq: Once | INTRAVENOUS | Status: AC | PRN
  Administered 2021-04-24: 20 mL via INTRAVENOUS

## 2021-04-26 ENCOUNTER — Telehealth: Payer: Self-pay

## 2021-04-26 DIAGNOSIS — R972 Elevated prostate specific antigen [PSA]: Secondary | ICD-10-CM

## 2021-04-26 NOTE — Telephone Encounter (Signed)
Patient called and notified. Placed on recall list

## 2021-04-26 NOTE — Telephone Encounter (Signed)
-----   Message from Festus Aloe, MD sent at 04/26/2021  2:03 PM EST ----- Let Scott Ball know his prostate MRI did not show any sign of prostate cancer and looks good. I will see him back around April 2023 as planned. Thank you.   ----- Message ----- From: Dorisann Frames, RN Sent: 04/26/2021  10:43 AM EST To: Festus Aloe, MD  Please review

## 2021-08-09 DIAGNOSIS — L570 Actinic keratosis: Secondary | ICD-10-CM | POA: Diagnosis not present

## 2021-09-09 DIAGNOSIS — R7303 Prediabetes: Secondary | ICD-10-CM | POA: Diagnosis not present

## 2021-09-09 DIAGNOSIS — Z23 Encounter for immunization: Secondary | ICD-10-CM | POA: Diagnosis not present

## 2021-09-09 DIAGNOSIS — M722 Plantar fascial fibromatosis: Secondary | ICD-10-CM | POA: Diagnosis not present

## 2021-09-09 DIAGNOSIS — I10 Essential (primary) hypertension: Secondary | ICD-10-CM | POA: Diagnosis not present

## 2021-10-04 ENCOUNTER — Encounter: Payer: Self-pay | Admitting: Urology

## 2021-10-04 ENCOUNTER — Ambulatory Visit: Payer: Medicare Other | Admitting: Urology

## 2021-10-04 VITALS — BP 153/83 | HR 64

## 2021-10-04 DIAGNOSIS — N138 Other obstructive and reflux uropathy: Secondary | ICD-10-CM | POA: Diagnosis not present

## 2021-10-04 DIAGNOSIS — C61 Malignant neoplasm of prostate: Secondary | ICD-10-CM | POA: Diagnosis not present

## 2021-10-04 DIAGNOSIS — N401 Enlarged prostate with lower urinary tract symptoms: Secondary | ICD-10-CM

## 2021-10-04 DIAGNOSIS — R350 Frequency of micturition: Secondary | ICD-10-CM

## 2021-10-04 NOTE — Progress Notes (Signed)
? ?10/04/2021 ?9:53 AM  ? ?Scott Ball ?1956/04/19 ?[id number] ? ?Referring provider: Bonnita Hollow, MD ?[address] [place] ?[place],  [place] [address] ? ?No chief complaint on file. ? ? ?HPI: ? ?F/u -  ?  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 1/12 cores.  His 10/22 PSA was 5.9, so we got a prostate MRI Nov 2022 which was benign (PIRADS 2) with a 90 g prostate.   ?  ?No FH of PCa, but his brother has a high PSA and several biopsies.  ?  ? ?2) BPH - LUTS for since 2019. Prostate about 80 - 90 g on imaging. No meds or surgery. He takes a diuretic. Occasional nocturia (1-2), flow starts and stops. AUASS = 10, mostly satisfied.  ?  ?  ?He takes a daily ASA. 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   ? ?PMH: ?Past Medical History:  ?Diagnosis Date  ? Acid reflux   ? Asthma   ? Gout   ? Hypertension   ? ? ?Surgical History: ?Past Surgical History:  ?Procedure Laterality Date  ? BACK SURGERY  2015  ? REPLACEMENT TOTAL KNEE  2021  ? ? ?Home Medications:  ?Allergies as of 10/04/2021   ? ?   Reactions  ? Succinylsulphathiazole Itching  ? Sulfamethoxazole Anaphylaxis  ? ?  ? ?  ?Medication List  ?  ? ?  ? Accurate as of October 04, 2021  9:53 AM. If you have any questions, ask your nurse or doctor.  ?  ?  ? ?  ? ?allopurinol 300 MG tablet ?Commonly known as: ZYLOPRIM ?Take 300 mg by mouth daily. ?  ?aspirin 81 MG EC tablet ?Take by mouth. ?  ?chlorthalidone 25 MG tablet ?Commonly known as: HYGROTON ?Take 25 mg by mouth daily. ?  ?diltiazem 240 MG 24 hr capsule ?Commonly known as: CARDIZEM CD ?Take 240 mg by mouth daily. ?  ?gabapentin 300 MG capsule ?Commonly known as: NEURONTIN ?  ?levofloxacin 750 MG tablet ?Commonly known as: Levaquin ?Take 1 tablet (750 mg total) by mouth daily. Take 2 hours prior to your procedure ?  ?losartan 100 MG tablet ?Commonly known as: COZAAR ?Take 100 mg by mouth daily. ?  ?meloxicam 7.5 MG  tablet ?Commonly known as: MOBIC ?Take 7.5 mg by mouth daily as needed. ?  ?metFORMIN 500 MG 24 hr tablet ?Commonly known as: GLUCOPHAGE-XR ?Take 500 mg by mouth daily. ?  ?metoprolol succinate 100 MG 24 hr tablet ?Commonly known as: TOPROL-XL ?Take 100 mg by mouth daily. ?  ?oxyCODONE 5 MG immediate release tablet ?Commonly known as: Oxy IR/ROXICODONE ?Take by mouth. ?  ?rosuvastatin 5 MG tablet ?Commonly known as: CRESTOR ?Take by mouth. ?  ?traMADol 50 MG tablet ?Commonly known as: ULTRAM ?Take 50 mg by mouth every 6 (six) hours as needed. ?  ? ?  ? ? ?Allergies:  ?Allergies  ?Allergen Reactions  ? Succinylsulphathiazole Itching  ? Sulfamethoxazole Anaphylaxis  ? ? ?Family History: ?No family history on file. ? ?Social History:  reports that he has never smoked. He has never used smokeless tobacco. He reports that he does not drink alcohol and does not use drugs. ? ? ?Physical Exam: ?BP (!) 153/83   Pulse 64   ?Constitutional:  Alert and oriented, No acute distress. ?HEENT: [place] AT, moist mucus membranes.  Trachea midline, no masses. ?  Cardiovascular: No clubbing, cyanosis, or edema. ?Respiratory: Normal respiratory effort, no increased work of breathing. ?GI: Abdomen is soft, nontender, nondistended, no abdominal masses ?GU: No CVA tenderness ?Lymph: No cervical or inguinal lymphadenopathy. ?Skin: No rashes, bruises or suspicious lesions. ?Neurologic: Grossly intact, no focal deficits, moving all 4 extremities. ?Psychiatric: Normal mood and affect. ?DRE: prostate about 50 g and smooth - no hard area or nodule  ? ? ?Laboratory Data: ?No results found for: WBC, HGB, HCT, MCV, PLT ? ?No results found for: CREATININE ? ?No results found for: PSA ? ?No results found for: TESTOSTERONE ? ?No results found for: HGBA1C ? ?Urinalysis ?   ?Component Value Date/Time  ? APPEARANCEUR Clear 04/05/2021 1542  ? GLUCOSEU Negative 04/05/2021 1542  ? BILIRUBINUR Negative 04/05/2021 1542  ? PROTEINUR Negative 04/05/2021 1542  ?  NITRITE Negative 04/05/2021 1542  ? LEUKOCYTESUR Negative 04/05/2021 1542  ? ? ?Lab Results  ?Component Value Date  ? LABMICR Comment 04/05/2021  ? [place] None seen 08/24/2020  ? LABEPIT None seen 08/24/2020  ? MUCUS Present (A) 06/26/2020  ? BACTERIA None seen 08/24/2020  ? ? ?Pertinent Imaging: ?Prostate MRI Nov 2022 - images reviewed  ? ? ?Assessment & Plan:   ? ?1. BPH with obstruction/lower urinary tract symptoms -continue surveillance ? ?- Urinalysis, Routine w reflex microscopic ? ?2. Prostate cancer -he is exam was benign today.  His PSA density remains normal.  We will continue to monitor -PSA was sent today and we will recheck in 6 months with an office visit.  Prior MRI benign.  ? ?No follow-ups on file. ? ?Festus Aloe, MD ? ?[facility]  ?[place], [place] [address] ?[phone] ? ? ?

## 2021-10-05 LAB — URINALYSIS, ROUTINE W REFLEX MICROSCOPIC
Bilirubin, UA: NEGATIVE
Glucose, UA: NEGATIVE
Ketones, UA: NEGATIVE
Leukocytes,UA: NEGATIVE
Nitrite, UA: NEGATIVE
Protein,UA: NEGATIVE
RBC, UA: NEGATIVE
Specific Gravity, UA: 1.025 (ref 1.005–1.030)
Urobilinogen, Ur: 0.2 mg/dL (ref 0.2–1.0)
pH, UA: 5.5 (ref 5.0–7.5)

## 2021-10-05 LAB — PSA: Prostate Specific Ag, Serum: 5.2 ng/mL — ABNORMAL HIGH (ref 0.0–4.0)

## 2022-01-21 DIAGNOSIS — Z13220 Encounter for screening for lipoid disorders: Secondary | ICD-10-CM | POA: Diagnosis not present

## 2022-01-21 DIAGNOSIS — E1169 Type 2 diabetes mellitus with other specified complication: Secondary | ICD-10-CM | POA: Diagnosis not present

## 2022-01-21 DIAGNOSIS — I10 Essential (primary) hypertension: Secondary | ICD-10-CM | POA: Diagnosis not present

## 2022-01-25 DIAGNOSIS — E1169 Type 2 diabetes mellitus with other specified complication: Secondary | ICD-10-CM | POA: Diagnosis not present

## 2022-01-25 DIAGNOSIS — M722 Plantar fascial fibromatosis: Secondary | ICD-10-CM | POA: Diagnosis not present

## 2022-01-25 DIAGNOSIS — I10 Essential (primary) hypertension: Secondary | ICD-10-CM | POA: Diagnosis not present

## 2022-01-25 DIAGNOSIS — R7989 Other specified abnormal findings of blood chemistry: Secondary | ICD-10-CM | POA: Diagnosis not present

## 2022-02-02 DIAGNOSIS — Z85828 Personal history of other malignant neoplasm of skin: Secondary | ICD-10-CM | POA: Diagnosis not present

## 2022-02-02 DIAGNOSIS — L570 Actinic keratosis: Secondary | ICD-10-CM | POA: Diagnosis not present

## 2022-03-28 ENCOUNTER — Other Ambulatory Visit: Payer: Medicare Other

## 2022-03-28 DIAGNOSIS — C61 Malignant neoplasm of prostate: Secondary | ICD-10-CM

## 2022-03-29 LAB — PSA: Prostate Specific Ag, Serum: 5.5 ng/mL — ABNORMAL HIGH (ref 0.0–4.0)

## 2022-04-04 ENCOUNTER — Ambulatory Visit: Payer: Medicare Other | Admitting: Urology

## 2022-04-04 ENCOUNTER — Encounter: Payer: Self-pay | Admitting: Urology

## 2022-04-04 VITALS — BP 151/78 | HR 65

## 2022-04-04 DIAGNOSIS — Z8546 Personal history of malignant neoplasm of prostate: Secondary | ICD-10-CM

## 2022-04-04 DIAGNOSIS — N400 Enlarged prostate without lower urinary tract symptoms: Secondary | ICD-10-CM | POA: Diagnosis not present

## 2022-04-04 DIAGNOSIS — C61 Malignant neoplasm of prostate: Secondary | ICD-10-CM

## 2022-04-04 LAB — URINALYSIS, ROUTINE W REFLEX MICROSCOPIC
Bilirubin, UA: NEGATIVE
Glucose, UA: NEGATIVE
Ketones, UA: NEGATIVE
Leukocytes,UA: NEGATIVE
Nitrite, UA: NEGATIVE
Protein,UA: NEGATIVE
RBC, UA: NEGATIVE
Specific Gravity, UA: 1.02 (ref 1.005–1.030)
Urobilinogen, Ur: 0.2 mg/dL (ref 0.2–1.0)
pH, UA: 5 (ref 5.0–7.5)

## 2022-04-04 NOTE — Progress Notes (Unsigned)
04/04/2022 9:03 AM   Marlana Salvage 1956-01-11 [id number]  Referring provider: Bonnita Hollow, MD [place],  [place] [address]  No chief complaint on file.   HPI:  F/u -    1) PCa - pt diagnosed with low risk PCa March 2022 with PSA 4.3, T1c, Prostate 78 grams, Gleason 3+3=6 in one core, 5%. 1/12 cores.     His 06/03/2020 PSA was 4.2. His brother has high PSA and has had several biopsies. Repeat 02/22 PSA was 4.3. His 10/22 PSA was 5.9. His Nov 2022 p MRI - benign with a 90 g prostate.   Bx: Mar 2022 - GG1, 5%, 1/12, PSA 4.3   Staging: Nov 2022 pMRI - benign (PIRADS 2), 90 g prostate      2) BPH - LUTS for since 2019. Prostate 78 grams on [place]. No meds or surgery. He takes a diuretic. Occasional nocturia (1-2), flow starts and stops. AUASS = 10, mostly satisfied.    Today, seen for the above. His Apr 2023 PSA was 5.3 with a normal DRE and Oct 2023 PSA 5.5.    He takes a daily ASA. 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   PMH: Past Medical History:  Diagnosis Date   Acid reflux    Asthma    Gout    Hypertension     Surgical History: Past Surgical History:  Procedure Laterality Date   BACK SURGERY  2015   REPLACEMENT TOTAL KNEE  2021    Home Medications:  Allergies as of 04/04/2022       Reactions   Succinylsulphathiazole Itching   Sulfamethoxazole Anaphylaxis        Medication List        Accurate as of April 04, 2022  9:03 AM. If you have any questions, ask your nurse or doctor.          allopurinol 300 MG tablet Commonly known as: ZYLOPRIM Take 300 mg by mouth daily.   aspirin EC 81 MG tablet Take by mouth.   chlorthalidone 25 MG tablet Commonly known as: HYGROTON Take 25 mg by mouth daily.   diltiazem 240 MG 24 hr capsule Commonly known as: CARDIZEM CD Take 240 mg by mouth daily.   gabapentin 300 MG capsule Commonly known as: NEURONTIN   levofloxacin 750 MG tablet Commonly known as: Levaquin Take 1 tablet (750  mg total) by mouth daily. Take 2 hours prior to your procedure   losartan 100 MG tablet Commonly known as: COZAAR Take 100 mg by mouth daily.   meloxicam 7.5 MG tablet Commonly known as: MOBIC Take 7.5 mg by mouth daily as needed.   metFORMIN 500 MG 24 hr tablet Commonly known as: GLUCOPHAGE-XR Take 500 mg by mouth daily.   metoprolol succinate 100 MG 24 hr tablet Commonly known as: TOPROL-XL Take 100 mg by mouth daily.   oxyCODONE 5 MG immediate release tablet Commonly known as: Oxy IR/ROXICODONE Take by mouth.   rosuvastatin 5 MG tablet Commonly known as: CRESTOR Take by mouth.   traMADol 50 MG tablet Commonly known as: ULTRAM Take 50 mg by mouth every 6 (six) hours as needed.        Allergies:  Allergies  Allergen Reactions   Succinylsulphathiazole Itching   Sulfamethoxazole Anaphylaxis    Family History: No family history on file.  Social History:  reports that he has never smoked. He has never used smokeless tobacco. He reports that he does not drink  alcohol and does not use drugs.   Physical Exam: BP (!) 151/78   Pulse 65   Constitutional:  Alert and oriented, No acute distress. HEENT: [place] AT, moist mucus membranes.  Trachea midline, no masses. Cardiovascular: No clubbing, cyanosis, or edema. Respiratory: Normal respiratory effort, no increased work of breathing. GI: Abdomen is soft, nontender, nondistended, no abdominal masses GU: No CVA tenderness Skin: No rashes, bruises or suspicious lesions. Neurologic: Grossly intact, no focal deficits, moving all 4 extremities. Psychiatric: Normal mood and affect.  Laboratory Data: No results found for: "WBC", "HGB", "HCT", "MCV", "PLT"  No results found for: "CREATININE"  No results found for: "PSA"  No results found for: "TESTOSTERONE"  No results found for: "HGBA1C"  Urinalysis    Component Value Date/Time   APPEARANCEUR Clear 10/04/2021 1044   GLUCOSEU Negative 10/04/2021 1044   BILIRUBINUR  Negative 10/04/2021 1044   PROTEINUR Negative 10/04/2021 1044   NITRITE Negative 10/04/2021 1044   LEUKOCYTESUR Negative 10/04/2021 1044    Lab Results  Component Value Date   LABMICR Comment 10/04/2021   WBCUA None seen 08/24/2020   LABEPIT None seen 08/24/2020   MUCUS Present (A) 06/26/2020   BACTERIA None seen 08/24/2020    Pertinent Imaging: Prostate MRI nov 2022 images    Assessment & Plan:    1. Prostate cancer - PSA remains stable with a normal PSAD. Prior MRI with NED. Check PSA in 6 mo with DRE>  - Urinalysis, Routine w reflex microscopic  2. BPH without - continue surveillance   No follow-ups on file.  Festus Aloe, MD  [facility]  [address] [place],  [address] [phone]

## 2022-08-01 DIAGNOSIS — L989 Disorder of the skin and subcutaneous tissue, unspecified: Secondary | ICD-10-CM | POA: Diagnosis not present

## 2022-08-01 DIAGNOSIS — D485 Neoplasm of uncertain behavior of skin: Secondary | ICD-10-CM | POA: Diagnosis not present

## 2022-08-01 DIAGNOSIS — L570 Actinic keratosis: Secondary | ICD-10-CM | POA: Diagnosis not present

## 2022-08-01 DIAGNOSIS — Z85828 Personal history of other malignant neoplasm of skin: Secondary | ICD-10-CM | POA: Diagnosis not present

## 2022-08-17 DIAGNOSIS — R7989 Other specified abnormal findings of blood chemistry: Secondary | ICD-10-CM | POA: Diagnosis not present

## 2022-08-17 DIAGNOSIS — E1140 Type 2 diabetes mellitus with diabetic neuropathy, unspecified: Secondary | ICD-10-CM | POA: Diagnosis not present

## 2022-08-17 DIAGNOSIS — I10 Essential (primary) hypertension: Secondary | ICD-10-CM | POA: Diagnosis not present

## 2022-08-22 DIAGNOSIS — M722 Plantar fascial fibromatosis: Secondary | ICD-10-CM | POA: Diagnosis not present

## 2022-08-22 DIAGNOSIS — I10 Essential (primary) hypertension: Secondary | ICD-10-CM | POA: Diagnosis not present

## 2022-08-22 DIAGNOSIS — E1169 Type 2 diabetes mellitus with other specified complication: Secondary | ICD-10-CM | POA: Diagnosis not present

## 2022-08-22 DIAGNOSIS — R7989 Other specified abnormal findings of blood chemistry: Secondary | ICD-10-CM | POA: Diagnosis not present

## 2022-11-18 DIAGNOSIS — I10 Essential (primary) hypertension: Secondary | ICD-10-CM | POA: Diagnosis not present

## 2022-11-18 DIAGNOSIS — E7800 Pure hypercholesterolemia, unspecified: Secondary | ICD-10-CM | POA: Diagnosis not present

## 2022-11-18 DIAGNOSIS — E1165 Type 2 diabetes mellitus with hyperglycemia: Secondary | ICD-10-CM | POA: Diagnosis not present

## 2022-11-29 DIAGNOSIS — I10 Essential (primary) hypertension: Secondary | ICD-10-CM | POA: Diagnosis not present

## 2022-11-29 DIAGNOSIS — M722 Plantar fascial fibromatosis: Secondary | ICD-10-CM | POA: Diagnosis not present

## 2022-11-29 DIAGNOSIS — E1169 Type 2 diabetes mellitus with other specified complication: Secondary | ICD-10-CM | POA: Diagnosis not present

## 2022-11-29 DIAGNOSIS — R7989 Other specified abnormal findings of blood chemistry: Secondary | ICD-10-CM | POA: Diagnosis not present

## 2023-01-26 IMAGING — MR MR PROSTATE WO/W CM
12 series · 48 of 48 positions shown · IV contrast (multihance)
Comparison: None.

CLINICAL DATA: Prostate carcinoma, Gleason score 6. Active
surveillance.

EXAM:
MR PROSTATE WITHOUT AND WITH CONTRAST
TECHNIQUE: Multiplanar multisequence MRI images were obtained of the pelvis
centered about the prostate. Pre and post contrast images were
obtained.
CONTRAST:  20mL MULTIHANCE GADOBENATE DIMEGLUMINE 529 MG/ML IV SOLN

[Series 3: T2 · coronal · 3.0mm · 0.56mm/px · 1 of 23 slices shown (1 of 3)]
[im 1/23]
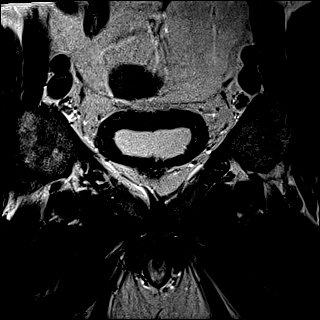

[Series 4: T1 · axial · 5.0mm · 1.25mm/px · 1 of 80 slices shown]
[im 1/80]
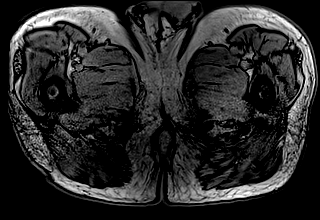

[Series 5: DWI · axial · 3.0mm · 1.75mm/px · z∈[-45,+24]mm · 2 of 72 slices shown (1 of 3)]
[im 1/72]
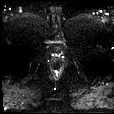
[im 72/72]
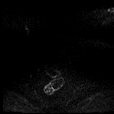

[Series 6: DWI · axial · 3.0mm · 1.75mm/px · 1 of 24 slices shown (2 of 3)]
[im 1/24]
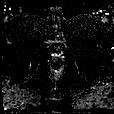

[Series 7: DWI · axial · 3.0mm · 1.75mm/px · 1 of 24 slices shown (3 of 3)]
[im 1/24]
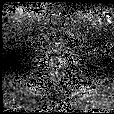

[Series 8: T2 · axial · 3.0mm · 0.56mm/px · 1 of 26 slices shown (2 of 3)]
[im 1/26]
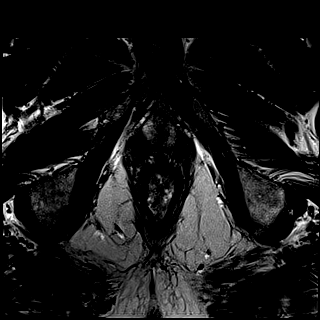

[Series 9: T2 · axial · 1.0mm · 1.04mm/px · z∈[-46,+25]mm · 2 of 67 slices shown (3 of 3)]
[im 1/67]
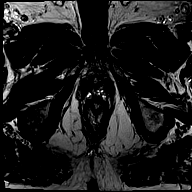
[im 67/67]
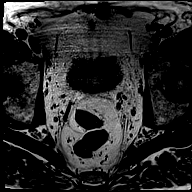

[Series 10: pre t1_twist_tra_dyn · axial · non-contrast · 3.5mm · 0.83mm/px · 1 of 20 slices shown]
[im 1/20]
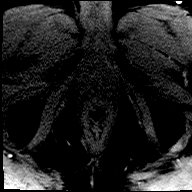

[Series 11: post t1_twist_tra_dyn-copy center · axial · non-contrast · 3.5mm · 0.83mm/px · z∈[-49,+18]mm · 17 of 593 slices shown]
[im 1/593]
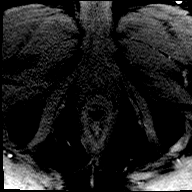
[im 38/593]
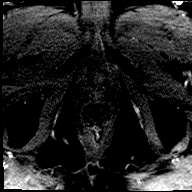
[im 75/593]
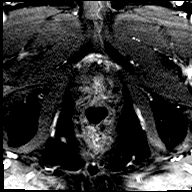
[im 112/593]
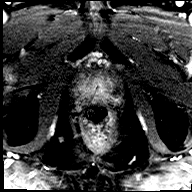
[im 149/593]
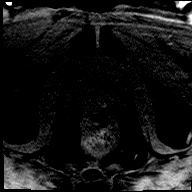
[im 186/593]
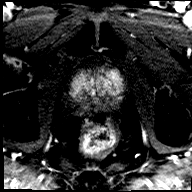
[im 223/593]
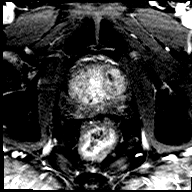
[im 260/593]
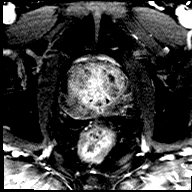
[im 297/593]
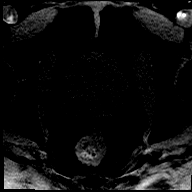
[im 334/593]
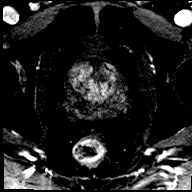
[im 371/593]
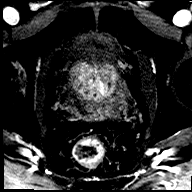
[im 408/593]
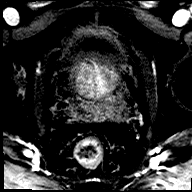
[im 445/593]
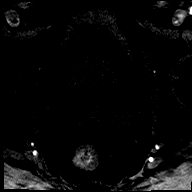
[im 482/593]
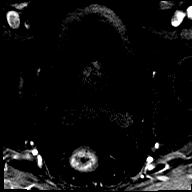
[im 519/593]
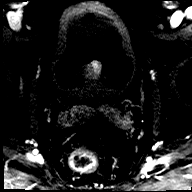
[im 556/593]
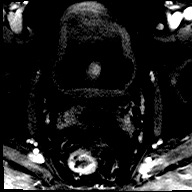
[im 593/593]
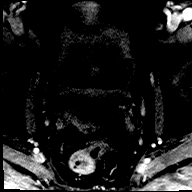

[Series 12: post t1_twist_tra_dyn-copy cent_sub · axial · 3.5mm · 0.83mm/px · z∈[-49,+18]mm · 17 of 579 slices shown]
[im 1/579]
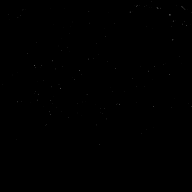
[im 37/579]
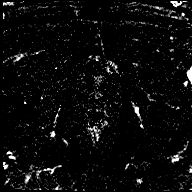
[im 73/579]
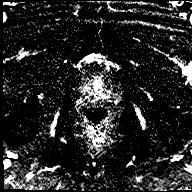
[im 109/579]
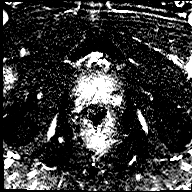
[im 145/579]
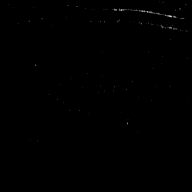
[im 181/579]
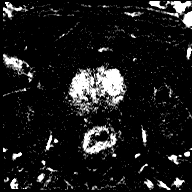
[im 217/579]
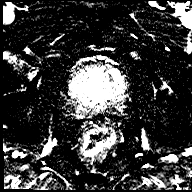
[im 253/579]
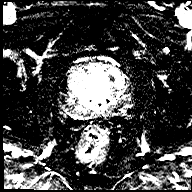
[im 290/579]
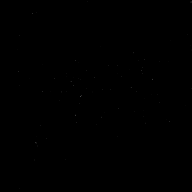
[im 326/579]
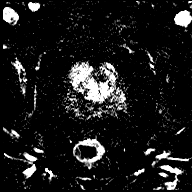
[im 362/579]
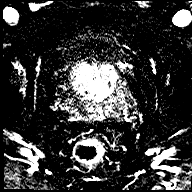
[im 398/579]
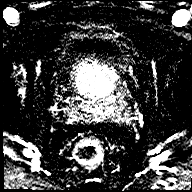
[im 434/579]
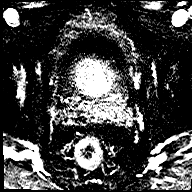
[im 470/579]
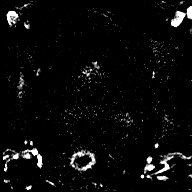
[im 506/579]
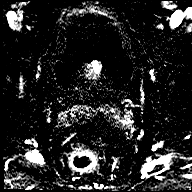
[im 542/579]
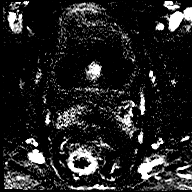
[im 579/579]
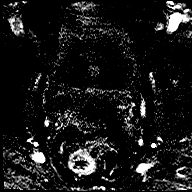

[Series 13: t1_vibe_dixon_tra_f · axial · 2.5mm · 0.91mm/px · z∈[-85,+113]mm · 2 of 80 slices shown]
[im 1/80]
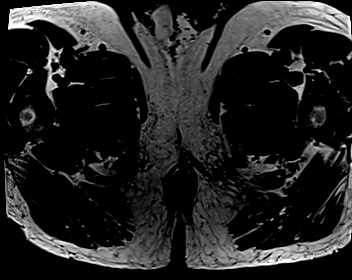
[im 80/80]
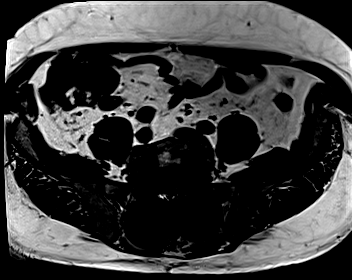

[Series 14: t1_vibe_dixon_tra_w · axial · 2.5mm · 0.91mm/px · z∈[-85,+113]mm · 2 of 80 slices shown]
[im 1/80]
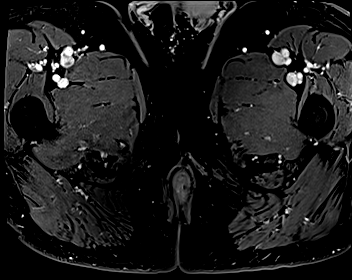
[im 80/80]
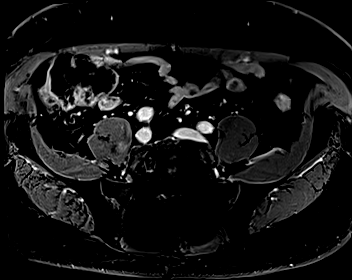

[48 of 48 positions shown; findings below may reference images not displayed]

FINDINGS: Prostate:

-- Peripheral Zone: Linear/wedge shaped hypointensities are noted on
ADC; however, no focal ADC hypointense or high b-value DWI
hyperintense nodules are identified. A few small partially extruded
BPH nodules are noted bilaterally.

-- Transition/Central Zone: Moderately enlarged with encapsulated
BPH nodules noted; however, no suspicious nodules are identified on
T2-weighted or diffusion sequences.

-- Measurements/Volume:  5.4 by 5.0 x 6.4 cm (volume = 90 cm^3)

Transcapsular spread:  Absent

Seminal vesicle involvement:  Absent

Neurovascular bundle involvement:  Absent

Pelvic adenopathy: None visualized

Bone metastasis: None visualized

Other: Mild diffuse bladder wall thickening, consistent with chronic
bladder outlet obstruction.
IMPRESSION: No radiographic evidence of high-grade prostate carcinoma. PI-RADS 2
(v.2.1): Low (clinically significant cancer unlikely)

## 2023-01-30 DIAGNOSIS — D485 Neoplasm of uncertain behavior of skin: Secondary | ICD-10-CM | POA: Diagnosis not present

## 2023-01-30 DIAGNOSIS — L814 Other melanin hyperpigmentation: Secondary | ICD-10-CM | POA: Diagnosis not present

## 2023-01-30 DIAGNOSIS — D2372 Other benign neoplasm of skin of left lower limb, including hip: Secondary | ICD-10-CM | POA: Diagnosis not present

## 2023-03-20 ENCOUNTER — Other Ambulatory Visit: Payer: Medicare Other

## 2023-03-20 DIAGNOSIS — C61 Malignant neoplasm of prostate: Secondary | ICD-10-CM

## 2023-03-21 LAB — PSA: Prostate Specific Ag, Serum: 6.3 ng/mL — ABNORMAL HIGH (ref 0.0–4.0)

## 2023-03-27 ENCOUNTER — Ambulatory Visit: Payer: Medicare Other | Admitting: Urology

## 2023-03-27 VITALS — BP 150/77 | HR 78

## 2023-03-27 DIAGNOSIS — C61 Malignant neoplasm of prostate: Secondary | ICD-10-CM | POA: Diagnosis not present

## 2023-03-27 DIAGNOSIS — N401 Enlarged prostate with lower urinary tract symptoms: Secondary | ICD-10-CM

## 2023-03-27 NOTE — Progress Notes (Unsigned)
03/27/2023 3:42 PM   Scott Ball Sep 10, 1955 [id number]  Referring provider: Donetta Potts, MD [address] [place],  [place] [address]  No chief complaint on file.   HPI:  F/u -    1) PCa - pt diagnosed with low risk PCa March 2022 with PSA 4.3, T1c, Prostate 78 grams, Gleason 3+3=6 in one core, 5%. 1/12 cores.     His 06/03/2020 PSA was 4.2. His brother has high PSA and has had several biopsies. Repeat 02/22 PSA was 4.3. His 10/22 PSA was 5.9. His Nov 2022 p MRI - benign with a 90 g prostate. is Apr 2023 PSA was 5.3 with a normal DRE and Oct 2023 PSA 5.5.    Bx: Mar 2022 - GG1, 5%, 1/12, PSA 4.3    Staging: Nov 2022 pMRI - benign (PIRADS 2), 90 g prostate      2) BPH - LUTS for since 2019. Prostate 78 grams on [place]. No meds or surgery. He takes a diuretic. Occasional nocturia (1-2), flow starts and stops. AUASS = 10, mostly satisfied.    Today, seen for the above. His Sep 2024 PSA is 6.3. Some frequency with diuretics.    He takes a daily ASA. 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    PMH: Past Medical History:  Diagnosis Date   Acid reflux    Asthma    Gout    Hypertension     Surgical History: Past Surgical History:  Procedure Laterality Date   BACK SURGERY  2015   REPLACEMENT TOTAL KNEE  2021    Home Medications:  Allergies as of 03/27/2023       Reactions   Succinylsulphathiazole Itching   Sulfamethoxazole Anaphylaxis        Medication List        Accurate as of March 27, 2023  3:42 PM. If you have any questions, ask your nurse or doctor.          allopurinol 300 MG tablet Commonly known as: ZYLOPRIM Take 300 mg by mouth daily.   aspirin EC 81 MG tablet Take by mouth.   chlorthalidone 25 MG tablet Commonly known as: HYGROTON Take 25 mg by mouth daily.   diltiazem 240 MG 24 hr capsule Commonly known as: CARDIZEM CD Take 240 mg by mouth daily.   gabapentin 300 MG capsule Commonly known as: NEURONTIN   levofloxacin  750 MG tablet Commonly known as: Levaquin Take 1 tablet (750 mg total) by mouth daily. Take 2 hours prior to your procedure   losartan 100 MG tablet Commonly known as: COZAAR Take 100 mg by mouth daily.   meloxicam 7.5 MG tablet Commonly known as: MOBIC Take 7.5 mg by mouth daily as needed.   metFORMIN 500 MG 24 hr tablet Commonly known as: GLUCOPHAGE-XR Take 500 mg by mouth daily.   metoprolol succinate 100 MG 24 hr tablet Commonly known as: TOPROL-XL Take 100 mg by mouth daily.   oxyCODONE 5 MG immediate release tablet Commonly known as: Oxy IR/ROXICODONE Take by mouth.   rosuvastatin 5 MG tablet Commonly known as: CRESTOR Take by mouth.   traMADol 50 MG tablet Commonly known as: ULTRAM Take 50 mg by mouth every 6 (six) hours as needed.        Allergies:  Allergies  Allergen Reactions   Succinylsulphathiazole Itching   Sulfamethoxazole Anaphylaxis    Family History: No family history on file.  Social History:  reports that he has never smoked.  He has never used smokeless tobacco. He reports that he does not drink alcohol and does not use drugs.   Physical Exam: BP (!) 150/77   Pulse 78   Constitutional:  Alert and oriented, No acute distress. HEENT: [place] AT, moist mucus membranes.  Trachea midline, no masses. Cardiovascular: No clubbing, cyanosis, or edema. Respiratory: Normal respiratory effort, no increased work of breathing. GI: Abdomen is soft, nontender, nondistended, no abdominal masses GU: No CVA tenderness Skin: No rashes, bruises or suspicious lesions. Neurologic: Grossly intact, no focal deficits, moving all 4 extremities. Psychiatric: Normal mood and affect. DRE: Prostate 50 g, smooth without hard area or nodule   Laboratory Data: No results found for: "WBC", "HGB", "HCT", "MCV", "PLT"  No results found for: "CREATININE"  No results found for: "PSA"  No results found for: "TESTOSTERONE"  No results found for: "HGBA1C"  Urinalysis     Component Value Date/Time   APPEARANCEUR Clear 04/04/2022 0906   GLUCOSEU Negative 04/04/2022 0906   BILIRUBINUR Negative 04/04/2022 0906   PROTEINUR Negative 04/04/2022 0906   NITRITE Negative 04/04/2022 0906   LEUKOCYTESUR Negative 04/04/2022 0906    Lab Results  Component Value Date   LABMICR Comment 10/04/2021   WBCUA None seen 08/24/2020   LABEPIT None seen 08/24/2020   MUCUS Present (A) 06/26/2020   BACTERIA None seen 08/24/2020    Pertinent Imaging: N/a  Assessment & Plan:    1. Prostate cancer (HCC) PSA up slightly but PSAV and PSAD remain normal. Check pMRI.  - Urinalysis, Routine w reflex microscopic  2. BPH with LUTS - continue surveillance   No follow-ups on file.  Jerilee Field, MD  [facility]  [address] [place], [place] [address] [phone]

## 2023-03-28 LAB — URINALYSIS, ROUTINE W REFLEX MICROSCOPIC
Bilirubin, UA: NEGATIVE
Glucose, UA: NEGATIVE
Ketones, UA: NEGATIVE
Leukocytes,UA: NEGATIVE
Nitrite, UA: NEGATIVE
Protein,UA: NEGATIVE
RBC, UA: NEGATIVE
Specific Gravity, UA: 1.03 (ref 1.005–1.030)
Urobilinogen, Ur: 0.2 mg/dL (ref 0.2–1.0)
pH, UA: 5.5 (ref 5.0–7.5)

## 2023-04-13 ENCOUNTER — Ambulatory Visit
Admission: RE | Admit: 2023-04-13 | Discharge: 2023-04-13 | Disposition: A | Discharge status: Home / Self Care | Payer: Medicare Other | Source: Ambulatory Visit | Attending: Urology | Admitting: Urology

## 2023-04-13 DIAGNOSIS — C61 Malignant neoplasm of prostate: Secondary | ICD-10-CM

## 2023-04-13 MED ORDER — GADOPICLENOL 0.5 MMOL/ML IV SOLN
10.0000 mL | Freq: Once | INTRAVENOUS | Status: AC | PRN
  Administered 2023-04-13: 10 mL via INTRAVENOUS

## 2023-05-16 ENCOUNTER — Telehealth: Payer: Self-pay | Admitting: Urology

## 2023-05-16 NOTE — Telephone Encounter (Signed)
Can you please review imaging

## 2023-05-16 NOTE — Telephone Encounter (Signed)
Pt had MRI a month ago and would like results

## 2023-05-17 NOTE — Telephone Encounter (Signed)
Patient aware of MD response and will follow up as scheduled.

## 2023-05-26 DIAGNOSIS — R7989 Other specified abnormal findings of blood chemistry: Secondary | ICD-10-CM | POA: Diagnosis not present

## 2023-05-26 DIAGNOSIS — E1140 Type 2 diabetes mellitus with diabetic neuropathy, unspecified: Secondary | ICD-10-CM | POA: Diagnosis not present

## 2023-05-26 DIAGNOSIS — E1165 Type 2 diabetes mellitus with hyperglycemia: Secondary | ICD-10-CM | POA: Diagnosis not present

## 2023-05-26 DIAGNOSIS — E7800 Pure hypercholesterolemia, unspecified: Secondary | ICD-10-CM | POA: Diagnosis not present

## 2023-05-26 DIAGNOSIS — I10 Essential (primary) hypertension: Secondary | ICD-10-CM | POA: Diagnosis not present

## 2023-05-31 DIAGNOSIS — I10 Essential (primary) hypertension: Secondary | ICD-10-CM | POA: Diagnosis not present

## 2023-05-31 DIAGNOSIS — R7989 Other specified abnormal findings of blood chemistry: Secondary | ICD-10-CM | POA: Diagnosis not present

## 2023-05-31 DIAGNOSIS — E1169 Type 2 diabetes mellitus with other specified complication: Secondary | ICD-10-CM | POA: Diagnosis not present

## 2023-05-31 DIAGNOSIS — Z0001 Encounter for general adult medical examination with abnormal findings: Secondary | ICD-10-CM | POA: Diagnosis not present

## 2023-05-31 DIAGNOSIS — M722 Plantar fascial fibromatosis: Secondary | ICD-10-CM | POA: Diagnosis not present

## 2023-06-29 DIAGNOSIS — R7989 Other specified abnormal findings of blood chemistry: Secondary | ICD-10-CM | POA: Diagnosis not present

## 2023-07-13 DIAGNOSIS — Z23 Encounter for immunization: Secondary | ICD-10-CM | POA: Diagnosis not present

## 2023-07-13 DIAGNOSIS — I10 Essential (primary) hypertension: Secondary | ICD-10-CM | POA: Diagnosis not present

## 2023-08-28 DIAGNOSIS — L570 Actinic keratosis: Secondary | ICD-10-CM | POA: Diagnosis not present

## 2023-08-28 DIAGNOSIS — L821 Other seborrheic keratosis: Secondary | ICD-10-CM | POA: Diagnosis not present

## 2023-09-18 ENCOUNTER — Other Ambulatory Visit: Payer: Medicare Other

## 2023-09-18 DIAGNOSIS — C61 Malignant neoplasm of prostate: Secondary | ICD-10-CM

## 2023-09-19 ENCOUNTER — Telehealth: Payer: Self-pay

## 2023-09-19 ENCOUNTER — Other Ambulatory Visit: Payer: Self-pay

## 2023-09-19 DIAGNOSIS — N400 Enlarged prostate without lower urinary tract symptoms: Secondary | ICD-10-CM

## 2023-09-19 LAB — PSA: Prostate Specific Ag, Serum: 7 ng/mL — ABNORMAL HIGH (ref 0.0–4.0)

## 2023-09-19 NOTE — Telephone Encounter (Signed)
 Called Pt to relay results from MD and reschedule appt Pt voiced understanding and confirmed new appt

## 2023-09-19 NOTE — Telephone Encounter (Signed)
-----   Message from Jerilee Field sent at 09/19/2023  2:12 PM EDT ----- Let Molly Maduro know his PSA is elevated and up slightly but still normal for his prostate size. See me back in 6 months with a PSA prior - thanks ----- Message ----- From: Sarajane Jews, CMA Sent: 09/19/2023   8:12 AM EDT To: Jerilee Field, MD  Appt 04/07 Please review.

## 2023-09-25 ENCOUNTER — Ambulatory Visit: Payer: Medicare Other | Admitting: Urology

## 2024-01-04 DIAGNOSIS — E559 Vitamin D deficiency, unspecified: Secondary | ICD-10-CM | POA: Diagnosis not present

## 2024-01-04 DIAGNOSIS — D559 Anemia due to enzyme disorder, unspecified: Secondary | ICD-10-CM | POA: Diagnosis not present

## 2024-01-04 DIAGNOSIS — R739 Hyperglycemia, unspecified: Secondary | ICD-10-CM | POA: Diagnosis not present

## 2024-01-04 DIAGNOSIS — I10 Essential (primary) hypertension: Secondary | ICD-10-CM | POA: Diagnosis not present

## 2024-01-04 DIAGNOSIS — E7849 Other hyperlipidemia: Secondary | ICD-10-CM | POA: Diagnosis not present

## 2024-01-11 DIAGNOSIS — Z0001 Encounter for general adult medical examination with abnormal findings: Secondary | ICD-10-CM | POA: Diagnosis not present

## 2024-01-11 DIAGNOSIS — I10 Essential (primary) hypertension: Secondary | ICD-10-CM | POA: Diagnosis not present

## 2024-01-11 DIAGNOSIS — E1169 Type 2 diabetes mellitus with other specified complication: Secondary | ICD-10-CM | POA: Diagnosis not present

## 2024-01-11 DIAGNOSIS — E7800 Pure hypercholesterolemia, unspecified: Secondary | ICD-10-CM | POA: Diagnosis not present

## 2024-01-11 DIAGNOSIS — R809 Proteinuria, unspecified: Secondary | ICD-10-CM | POA: Diagnosis not present

## 2024-02-12 DIAGNOSIS — I10 Essential (primary) hypertension: Secondary | ICD-10-CM | POA: Diagnosis not present

## 2024-02-12 DIAGNOSIS — E7800 Pure hypercholesterolemia, unspecified: Secondary | ICD-10-CM | POA: Diagnosis not present

## 2024-02-12 DIAGNOSIS — E1169 Type 2 diabetes mellitus with other specified complication: Secondary | ICD-10-CM | POA: Diagnosis not present

## 2024-03-18 ENCOUNTER — Other Ambulatory Visit

## 2024-03-25 ENCOUNTER — Ambulatory Visit: Admitting: Urology

## 2024-04-29 NOTE — Progress Notes (Signed)
 KALEM ROCKWELL                                          MRN: [id number]   04/29/2024   The [facility] Quality Team Specialist reviewed this patient medical record for the purposes of chart review for care gap closure. The following were reviewed: chart review for care gap closure-kidney health evaluation for diabetes:eGFR  and uACR.    [facility] Quality Team

## 2024-10-28 ENCOUNTER — Other Ambulatory Visit

## 2024-11-04 ENCOUNTER — Ambulatory Visit: Admitting: Urology
